# Patient Record
Sex: Male | Born: 1950 | Race: White | Hispanic: No | Marital: Married | Smoking: Former smoker
Health system: Southern US, Community
[De-identification: ages and names within clinical notes are randomized; demographics above are authoritative.]

## PROBLEM LIST (undated history)

## (undated) DIAGNOSIS — M1A9XX1 Chronic gout, unspecified, with tophus (tophi): Secondary | ICD-10-CM

## (undated) DIAGNOSIS — Z972 Presence of dental prosthetic device (complete) (partial): Secondary | ICD-10-CM

## (undated) DIAGNOSIS — K219 Gastro-esophageal reflux disease without esophagitis: Secondary | ICD-10-CM

## (undated) DIAGNOSIS — D649 Anemia, unspecified: Secondary | ICD-10-CM

## (undated) DIAGNOSIS — M109 Gout, unspecified: Secondary | ICD-10-CM

## (undated) DIAGNOSIS — N189 Chronic kidney disease, unspecified: Secondary | ICD-10-CM

## (undated) DIAGNOSIS — G629 Polyneuropathy, unspecified: Secondary | ICD-10-CM

## (undated) DIAGNOSIS — Z87442 Personal history of urinary calculi: Secondary | ICD-10-CM

## (undated) HISTORY — PX: TONSILLECTOMY: SUR1361

## (undated) HISTORY — PX: VARICOSE VEIN SURGERY: SHX832

## (undated) HISTORY — PX: EYE SURGERY: SHX253

---

## 2011-07-27 ENCOUNTER — Ambulatory Visit: Payer: Self-pay

## 2012-05-24 DIAGNOSIS — L719 Rosacea, unspecified: Secondary | ICD-10-CM | POA: Insufficient documentation

## 2012-05-24 DIAGNOSIS — M1A00X1 Idiopathic chronic gout, unspecified site, with tophus (tophi): Secondary | ICD-10-CM | POA: Diagnosis present

## 2013-07-11 ENCOUNTER — Inpatient Hospital Stay: Payer: Self-pay | Admitting: Internal Medicine

## 2013-07-11 LAB — CBC
HGB: 14.9 g/dL (ref 13.0–18.0)
MCH: 34.3 pg — ABNORMAL HIGH (ref 26.0–34.0)
MCHC: 35 g/dL (ref 32.0–36.0)
MCV: 98 fL (ref 80–100)
RBC: 4.34 10*6/uL — ABNORMAL LOW (ref 4.40–5.90)
RDW: 15 % — ABNORMAL HIGH (ref 11.5–14.5)
WBC: 12.1 10*3/uL — ABNORMAL HIGH (ref 3.8–10.6)

## 2013-07-11 LAB — BASIC METABOLIC PANEL
Anion Gap: 8 (ref 7–16)
BUN: 9 mg/dL (ref 7–18)
Chloride: 100 mmol/L (ref 98–107)
Co2: 24 mmol/L (ref 21–32)
Creatinine: 0.73 mg/dL (ref 0.60–1.30)
EGFR (African American): >60
EGFR (Non-African Amer.): >60
Osmolality: 264 (ref 275–301)
Potassium: 3.4 mmol/L — ABNORMAL LOW (ref 3.5–5.1)
Sodium: 132 mmol/L — ABNORMAL LOW (ref 136–145)

## 2013-07-12 LAB — CBC WITH DIFFERENTIAL/PLATELET
Basophil #: 0 10*3/uL (ref 0.0–0.1)
Eosinophil #: 0 10*3/uL (ref 0.0–0.7)
Eosinophil %: 0 %
HCT: 39.2 % — ABNORMAL LOW (ref 40.0–52.0)
Lymphocyte #: 0.4 10*3/uL — ABNORMAL LOW (ref 1.0–3.6)
Lymphocyte %: 5 %
MCH: 34.6 pg — ABNORMAL HIGH (ref 26.0–34.0)
MCHC: 35.4 g/dL (ref 32.0–36.0)
MCV: 98 fL (ref 80–100)
Monocyte #: 0.2 x10 3/mm (ref 0.2–1.0)
Neutrophil %: 92.6 %
Platelet: 142 10*3/uL — ABNORMAL LOW (ref 150–440)
RBC: 4.01 10*6/uL — ABNORMAL LOW (ref 4.40–5.90)
RDW: 14.3 % (ref 11.5–14.5)

## 2013-07-12 LAB — SYNOVIAL CELL COUNT + DIFF, W/ CRYSTALS
Eosinophil: 0 %
Neutrophils: 94 %
Nucleated Cell Count: 6141 /mm3
Other Cells BF: 0 %
Other Mononuclear Cells: 4 %

## 2013-07-12 LAB — BASIC METABOLIC PANEL
Anion Gap: 8 (ref 7–16)
BUN: 10 mg/dL (ref 7–18)
Calcium, Total: 8.9 mg/dL (ref 8.5–10.1)
Chloride: 101 mmol/L (ref 98–107)
EGFR (African American): >60
EGFR (Non-African Amer.): >60
Glucose: 133 mg/dL — ABNORMAL HIGH (ref 65–99)
Osmolality: 269 (ref 275–301)
Potassium: 3.8 mmol/L (ref 3.5–5.1)

## 2013-07-13 LAB — URIC ACID: Uric Acid: 5.8 mg/dL (ref 3.5–7.2)

## 2015-02-22 NOTE — Consult Note (Signed)
Brief Consult Note: Diagnosis: severe gout.   Patient was seen by consultant.   Comments: will recheck in am.  Electronic Signatures: Laurene Footman (MD)  (Signed 10-Sep-14 17:01)  Authored: Brief Consult Note   Last Updated: 10-Sep-14 17:01 by Laurene Footman (MD)

## 2015-02-22 NOTE — H&P (Signed)
PATIENT NAME:  Mark Jimenez, Mark Jimenez MR#:  [id number] DATE OF BIRTH:  1951-07-28  DATE OF ADMISSION:  07/11/2013  PRIMARY CARE PHYSICIAN: Dr. Petra Kuba in [place].   PRIMARY RHEUMATOLOGIST: At [facility].   REFERRING EMERGENCY ROOM PHYSICIAN: Dr. Lenise Arena.     CHIEF COMPLAINT: Joint pain.   HISTORY OF PRESENTING ILLNESS: A 64 year old male with a past medical history of gout since many years and gastric esophageal reflux disease who has his usual aches and pains on and off, which are slightly worse because of his increased activity for the last 2 weeks, but today morning he started having excruciating pain, especially in both knees and he was not able even \ stand up or to try to walk, so he decided to come to the Emergency Room. On arrival to ER, he was noted having acute gout flare and was given IV Dilaudid, and he felt a little better with that, but still not able to walk properly because of severe pain, and he is requiring IV pain medications, so hospitalist service is contacted to admit him for acute gouty attack. On further questioning, the patient denies having any fever or any injuries to his knees, and he says that all his joints are hurting, mainly knees, which is preventing him from walking.    REVIEW OF SYSTEMS:   CONSTITUTIONAL: Negative for fever, fatigue, weakness, but severe pain in the joints, mainly in the knees.  EYES: No blurring, double vision, pain or redness.  EARS, NOSE, THROAT: No tinnitus, ear pain or hearing loss.  RESPIRATORY: No cough, wheezing, hemoptysis, dyspnea or shortness of breath.  CARDIOVASCULAR: No chest pain, orthopnea, edema or palpitations.  GASTROINTESTINAL: No nausea, vomiting, diarrhea or abdominal pain.  GENITOURINARY: No dysuria, hematuria or increased frequency of urination.  ENDOCRINE: No nocturia, increased sweating or heat or cold intolerance.  SKIN: No acne or rashes.  MUSCULOSKELETAL: He has gout since many years, and he has enlarged nodes  in joints, swollen joints since many years, and due to that his mobility is restricted.  NEUROLOGICAL: Denies any numbness, weakness, tremors or vertigo.   PSYCHIATRIC: No anxiety, insomnia or bipolar disorder.   PAST MEDICAL HISTORY:  Chronic fibromyalgia, neuropathy, anxiety.   PAST SURGICAL HISTORY: He had varicose vein surgery on the right lower extremity 14 years ago, and since then his right lower extremity is remaining swollen.   SOCIAL HISTORY:  He chews cigars once a day. He drinks alcohol socially. He is a retired Clinical biochemist.   FAMILY HISTORY: Brother and mother both had diabetes.   HOME MEDICATIONS: Omeprazole 40 mg 2 times a day and indomethacin 50 mg capsule 3 times a day. He takes colchicine when he needs it for his pain.   PHYSICAL EXAMINATION: VITAL SIGNS: In the ER, temperature 98.3, pulse 96, respirations 18, blood pressure 164/79 and pulse oximetry 99 on room air.  GENERAL: The patient is fully alert and oriented to time, place and person and does not appear in any acute distress.  HEENT: Head and neck atraumatic. Conjunctivae pink. Oral mucosa moist.  NECK: Supple. No JVD.  RESPIRATORY: Bilateral clear and equal air entry.  CARDIOVASCULAR: S1, S2 present, regular. No murmur.  ABDOMEN: Soft, nontender. Bowel sounds present. No organomegaly.  SKIN: No rashes.  EXTREMITIES: Legs:  Bilateral chronic edema present. Joints:  All of his fingers and his elbows, as well as his toes and knees are swollen, which are chronically and painful. His knee on the right side is extremely tender on touch  and is warm also, and it appears red. There is chronic deformity and deviation of all his fingers and toes.  NEUROLOGICAL: Power 4/5, not able to move properly due to his severe swelling on the joints. No tremors or rigidity.  PSYCHIATRIC:  Not any acute distress.   LABORATORY RESULTS:  Glucose 112, BUN 9, creatinine 0.73, sodium 132, potassium 3.4, chloride 100, CO2 of 24, magnesium  1.7. White cell count 12.1, hemoglobin 14.9, platelet 155. Ultrasound of the lower extremity Doppler study, no evidence of thrombus. Popliteal cysts bilaterally and enlarging inguinal lymph nodes bilaterally.   ASSESSMENT AND PLAN: A 64 year old male with:  1. Past medical history of chronic gout, presented with acute gout flare. We will give him IV Solu-Medrol for his inflammatory response, and we will give IV Dilaudid for better pain and oral Percocet. We will also start him on oral colchicine and oral indomethacin oral long-term control of his gout. We will consult hematologist and orthopedics, as patient is not being followed by any hematologist and has an appointment with orthopedics, which is not done yet at [facility].  2.  Gastroesophageal reflux disease. We will continue Protonix.  3.  Hypokalemia and hypomagnesemia. We are replacing IV and orally, and we will check  tomorrow.   CODE STATUS: FULL CODE.   TOTAL TIME SPENT: 50 minutes in this admission.    ____________________________ Ceasar Lund Anselm Jungling, MD vgv:dmm D: 07/11/2013 20:01:21 ET T: 07/11/2013 20:16:06 ET JOB#: [id number]  cc: Ceasar Lund. Anselm Jungling, MD, <Dictator> Vaughan Basta MD ELECTRONICALLY SIGNED 07/18/2013 23:22

## 2015-02-22 NOTE — H&P (Signed)
PATIENT NAME:  Mark Jimenez, Mark Jimenez MR#:  [id number] DATE OF BIRTH:  March 01, 1951  ADDENDUM  ASSESSMENT AND PLAN: Tobacco abuse. The patient is chewing cigar every day and I counseled him for stopping the tobacco habit. He says he is not too much addicted to that and he will try to quit it, but he does not want a nicotine patch for that while here. Total time spent for counseling 4 minutes.    ____________________________ Ceasar Lund Anselm Jungling, MD vgv:np D: 07/11/2013 22:18:06 ET T: 07/11/2013 22:39:42 ET JOB#: [id number]  cc: Ceasar Lund. Anselm Jungling, MD, <Dictator> Vaughan Basta MD ELECTRONICALLY SIGNED 07/18/2013 23:22

## 2015-02-22 NOTE — Consult Note (Signed)
Brief Consult Note: Patient was seen by consultant.   Consult note dictated.   Orders entered.   Comments: 1 tophaceous gout , s/p intolerance of allopurinol, uloric ,  current flare in knees  2 suspect OA Knees 3 ETOH       left knee aspirated 60 cc cloudy fliud, sent for microscopy. both knees injected. cont colchine, pred taper. xray knees. probable candidate for peglitocase IV at med center(has apt at [facility]).  Electronic Signatures: Leeanne Mannan., Eugene Gavia (MD)  (Signed 10-Sep-14 17:44)  Authored: Brief Consult Note   Last Updated: 10-Sep-14 17:44 by Leeanne Mannan., Eugene Gavia (MD)

## 2015-02-22 NOTE — Discharge Summary (Signed)
PATIENT NAME:  Mark Jimenez, Mark Jimenez MR#:  [id number] DATE OF BIRTH:  08-13-1951  DATE OF ADMISSION:  07/11/2013 DATE OF DISCHARGE:  07/13/2013  DISCHARGE DIAGNOSES: 1.  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 with flare in the knee bilaterally status post left knee aspiration and bilateral knee injections, improving with Colcrys nonsteroidal anti-inflammatory medication and steroids.  2.  Hypokalemia and hypomagnesemia, repleted and resolved.   SECONDARY DIAGNOSES:  1.  Chronic fibromyalgia.  2.  Neuropathy.  3.  Anxiety.   CONSULTATIONS: 1.  Rheumatology, Dr. Jefm Bryant.  2.  Orthopedic, Dr. Hessie Knows.  3.  Physical therapy.   PROCEDURES AND RADIOLOGY:  1.  Left knee aspiration and bilateral knee injection of steroids.  2.  Left knee x-ray on 10th of September showed degenerative changes in the left knee.  No evidence of acute old fracture or dislocation.  Mild soft tissue swelling in the prepatellar region.   3.  Right knee x-ray on 10th of September showed diffuse osteopenia.  Moderate degenerative narrowing of the lateral joint compartment, mild narrowing medially.  Soft tissue calcification in the prepatellar region.  4.  Bilateral lower extremity Doppler on 9th of September showed no DVT.  Bilateral popliteal cyst.  Large inguinal lymph nodes bilaterally.   HISTORY AND SHORT HOSPITAL COURSE:  The patient is a 64 year old male with above-mentioned medical problem, was admitted for gout flare.  Please see Dr. Nichola Sizer dictated history and physical for further details.  Rheumatology consultation was obtained with Dr. Jefm Bryant who recommended continuing steroids and started him on colchicine along with nonsteroidals.  He also aspirated his left knee which showed monosodium urate crystals and injected bilateral knees which relieved the patient significantly.  The patient was also evaluated by orthopedic and did not have any further recommendation.  He was feeling much better  after injection, was able to walk with a walker and was discharged home on 11th of September in stable condition.   PHYSICAL EXAMINATION: VITAL SIGNS:  On the date of discharge temperature 97.6, heart rate 64 per minute, respirations 19 per minute, blood pressure 129/68 mmHg.  He was saturating 98% on room air.  Pertinent physical examination on the date of discharge:  CARDIOVASCULAR:  S1, S2 normal.  No murmurs, rubs or gallop.  LUNGS:  Clear to auscultation bilaterally.  No wheezing, rales, rhonchi or crepitation.  ABDOMEN:  Soft, benign.  EXTREMITIES:  He had large sebaceous changes throughout his fingers.  MUSCULOSKELETAL:  He had a large right elbow tophi.  Numerous hand tophaceous changes.  Left knee with contracture.  He had a large effusion on the right knee with warmth.  He also had a bilateral Baker's cyst and decreased range of motion of both ankles.  NEUROLOGIC:  Nonfocal examination.  All other physical examination remained at baseline.   DISCHARGE MEDICATIONS: 1.  Indomethacin 50 mg by mouth three times a day as needed.  2.  Omeprazole 40 mg by mouth twice daily.  3.  Prednisone 60 mg by mouth daily, taper 10 mg daily until finished.  4.  Colchicine 0.6 mg by mouth daily.   DISCHARGE DIET:  Regular.   DISCHARGE ACTIVITY:  As tolerated.   DISCHARGE INSTRUCTIONS AND FOLLOW-UP:  The patient was instructed to follow up with his primary care physician, Dr. Petra Kuba at [facility].  He was instructed to follow up with his primary rheumatology at [facility] in 2 to 4 weeks.  He was provided front-wheeled walker.   Total time discharging this  patient was 55 minutes.      ____________________________ Lucina Mellow. Manuella Ghazi, MD vss:ea D: 07/13/2013 23:05:13 ET T: 07/14/2013 00:04:24 ET JOB#: [id number]  cc: Ebrahim Deremer S. Manuella Ghazi, MD, <Dictator> Hall Busing. Petra Kuba, MD Meda Klinefelter., MD [facility] [place] MD ELECTRONICALLY SIGNED 07/14/2013 23:35

## 2015-02-22 NOTE — Consult Note (Signed)
PATIENT NAME:  Mark Jimenez, Mark Jimenez MR#:  [id number] DATE OF BIRTH:  12-29-1950  DATE OF CONSULTATION:  07/12/2013  REFERRING PHYSICIAN:  Dr. Max Sane. CONSULTING PHYSICIAN:  Meda Klinefelter., MD  REASON FOR CONSULTATION: This is a 64 year old white male who has a long history of alcohol, a long history of tophaceous gout. He has previously been seen by rheumatology at [facility]. He has been on disability. He has a family history of gout. He has previously been on allopurinol complicated by nausea and vomiting and hospitalization, and then Uloric complicated by nausea and vomiting. He has not been on probenecid. He does not have any kidney stone. He had a recent trip to the beach to watch a family member graduate, did a lot of walking. Knees had been bothering him. They have been bothering him off and on since, swollen in the back and the front of the knees. Can hardly ambulate.   He came to the Emergency Room where he was given IV steroids and admitted. He has been on colchicine and Indocin. Both knees still bother him. He cannot really ambulate. He has had tophaceous changes in his hands and has upcoming consultation at [facility] for possible excision. The left elbow has spontaneously drained. 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 He has not had any ear tophi. He continues to drink daily.   PAST MEDICAL HISTORY: Alcohol use, venous insufficiency, status post procedures, reflux, anxiety, tophaceous gout.   MEDICATIONS: Currently on Indocin, colchicine, IV steroids and oral prednisone and analgesics.   SOCIAL HISTORY: Alcohol use. No significant cigarettes.   FAMILY HISTORY: Positive for gout on his mom's side with uncles and cousins.   REVIEW OF SYSTEMS: No recent abdominal pain or chest pain or diarrhea.   PHYSICAL EXAMINATION:  GENERAL: Pleasant male complaining of knee pain, sitting on the side of the bed. Does not  really want to extend his left knee.  VITALS: Temperature 98, pulse 70,  respiratory rate 18, blood pressure 114/67.  CHEST: Clear.  HEART: No murmur.  ABDOMEN: Nontender.  HEENT: Bilateral helices of his ears are without any tophi.  OROPHARYNX: Clear.  SKIN: Brawny changes in the lower extremities with brawny edema, 1+ edema.  EXTREMITIES: Large sebaceous changes throughout his fingers.  MUSCULOSKELETAL: Good range of his neck and shoulders. Large right elbow tophi. Numerous hand tophaceous changes. Hips move well. Left knee with contracture. Large effusion, right knee, with warmth. Unclear if there is any definite effusion.  bilateral Bakers cysts. Decreased range of motion, both ankles.   PROCEDURE: Left knee was prepped in a sterile manner. I aspirated 60 mL of cloudy fluid. Injected with 2 mL of Xylocaine, 2 mL Marcaine and 1 mL of Kenalog.   Right knee was prepped in a sterile manner. Aspirate dry. Injected with 2 mL of Xylocaine, 2 mL Marcaine and 1 mL Kenalog.   IMPRESSION:  1.  Gouty flare of the knees. Cannot rule out underlying osteoarthritis of the knees.  2.  Tophaceous gout.  3.  Chronic alcohol use.  3.  FAILURE WITH INTOLERANCE TO ALLOPURINOL AND ULORIC.  PLAN:  1.  see how injection does. x-ray, both knees. Try to get out of bed with physical therapy.  2.  Taper prednisone. May keep on Indocin and colchicine.  3.  Probably this is a candidate for pegloticase IV treatment to lower uric acid which will need to be done at the medical center. He has an appointment at [facility].    ____________________________ Eugene Gavia  Leeanne Mannan., MD gwk:np D: 07/12/2013 17:51:00 ET T: 07/12/2013 19:25:34 ET JOB#: [id number]  cc: Meda Klinefelter., MD, <Dictator> Ovidio Hanger MD ELECTRONICALLY SIGNED 07/13/2013 13:22

## 2015-02-22 NOTE — Consult Note (Signed)
Brief Consult Note: Comments: knees much better.walking with walker. pos uric acids crystal in fliud. xrays show OA with calcifications... afebrile.less synovitis in knees.    rec. cont. colcrys daily. nsaids. taper steriod. uric acid. i gave him a pamphlet on peglitocase to discuss with his [facility] physicians.  Electronic Signatures: Leeanne Mannan., Eugene Gavia (MD)  (Signed 11-Sep-14 13:30)  Authored: Brief Consult Note   Last Updated: 11-Sep-14 13:30 by Leeanne Mannan., Eugene Gavia (MD)

## 2016-02-20 DIAGNOSIS — G609 Hereditary and idiopathic neuropathy, unspecified: Secondary | ICD-10-CM | POA: Diagnosis present

## 2016-02-20 DIAGNOSIS — E039 Hypothyroidism, unspecified: Secondary | ICD-10-CM | POA: Insufficient documentation

## 2016-02-20 DIAGNOSIS — F419 Anxiety disorder, unspecified: Secondary | ICD-10-CM | POA: Insufficient documentation

## 2020-08-30 ENCOUNTER — Emergency Department: Payer: Medicare Other

## 2020-08-30 ENCOUNTER — Other Ambulatory Visit: Payer: Self-pay

## 2020-08-30 ENCOUNTER — Inpatient Hospital Stay
Admission: EM | Admit: 2020-08-30 | Discharge: 2020-09-04 | DRG: 522 | Disposition: A | Discharge status: Skilled Nursing Facility | Payer: Medicare Other | Attending: Internal Medicine | Admitting: Internal Medicine

## 2020-08-30 DIAGNOSIS — M1A9XX1 Chronic gout, unspecified, with tophus (tophi): Secondary | ICD-10-CM | POA: Diagnosis present

## 2020-08-30 DIAGNOSIS — Z6823 Body mass index (BMI) 23.0-23.9, adult: Secondary | ICD-10-CM

## 2020-08-30 DIAGNOSIS — M4856XA Collapsed vertebra, not elsewhere classified, lumbar region, initial encounter for fracture: Secondary | ICD-10-CM | POA: Diagnosis present

## 2020-08-30 DIAGNOSIS — M1A00X1 Idiopathic chronic gout, unspecified site, with tophus (tophi): Secondary | ICD-10-CM | POA: Diagnosis present

## 2020-08-30 DIAGNOSIS — Z87891 Personal history of nicotine dependence: Secondary | ICD-10-CM

## 2020-08-30 DIAGNOSIS — R636 Underweight: Secondary | ICD-10-CM | POA: Diagnosis present

## 2020-08-30 DIAGNOSIS — N179 Acute kidney failure, unspecified: Secondary | ICD-10-CM | POA: Diagnosis present

## 2020-08-30 DIAGNOSIS — R296 Repeated falls: Secondary | ICD-10-CM | POA: Diagnosis present

## 2020-08-30 DIAGNOSIS — N1831 Chronic kidney disease, stage 3a: Secondary | ICD-10-CM | POA: Diagnosis present

## 2020-08-30 DIAGNOSIS — R030 Elevated blood-pressure reading, without diagnosis of hypertension: Secondary | ICD-10-CM

## 2020-08-30 DIAGNOSIS — I129 Hypertensive chronic kidney disease with stage 1 through stage 4 chronic kidney disease, or unspecified chronic kidney disease: Secondary | ICD-10-CM | POA: Diagnosis present

## 2020-08-30 DIAGNOSIS — N189 Chronic kidney disease, unspecified: Secondary | ICD-10-CM

## 2020-08-30 DIAGNOSIS — S72002A Fracture of unspecified part of neck of left femur, initial encounter for closed fracture: Secondary | ICD-10-CM | POA: Diagnosis present

## 2020-08-30 DIAGNOSIS — W19XXXA Unspecified fall, initial encounter: Secondary | ICD-10-CM

## 2020-08-30 DIAGNOSIS — G8918 Other acute postprocedural pain: Secondary | ICD-10-CM

## 2020-08-30 DIAGNOSIS — Z20822 Contact with and (suspected) exposure to covid-19: Secondary | ICD-10-CM | POA: Diagnosis present

## 2020-08-30 DIAGNOSIS — W010XXA Fall on same level from slipping, tripping and stumbling without subsequent striking against object, initial encounter: Secondary | ICD-10-CM | POA: Diagnosis present

## 2020-08-30 DIAGNOSIS — D62 Acute posthemorrhagic anemia: Secondary | ICD-10-CM | POA: Diagnosis not present

## 2020-08-30 DIAGNOSIS — G609 Hereditary and idiopathic neuropathy, unspecified: Secondary | ICD-10-CM | POA: Diagnosis present

## 2020-08-30 DIAGNOSIS — S72001A Fracture of unspecified part of neck of right femur, initial encounter for closed fracture: Secondary | ICD-10-CM

## 2020-08-30 DIAGNOSIS — M79605 Pain in left leg: Secondary | ICD-10-CM

## 2020-08-30 DIAGNOSIS — S72032A Displaced midcervical fracture of left femur, initial encounter for closed fracture: Principal | ICD-10-CM

## 2020-08-30 DIAGNOSIS — G629 Polyneuropathy, unspecified: Secondary | ICD-10-CM | POA: Diagnosis present

## 2020-08-30 DIAGNOSIS — F419 Anxiety disorder, unspecified: Secondary | ICD-10-CM | POA: Diagnosis present

## 2020-08-30 DIAGNOSIS — Z01811 Encounter for preprocedural respiratory examination: Secondary | ICD-10-CM

## 2020-08-30 DIAGNOSIS — M8580 Other specified disorders of bone density and structure, unspecified site: Secondary | ICD-10-CM

## 2020-08-30 DIAGNOSIS — E039 Hypothyroidism, unspecified: Secondary | ICD-10-CM | POA: Diagnosis present

## 2020-08-30 DIAGNOSIS — S32009A Unspecified fracture of unspecified lumbar vertebra, initial encounter for closed fracture: Secondary | ICD-10-CM

## 2020-08-30 DIAGNOSIS — Z01818 Encounter for other preprocedural examination: Secondary | ICD-10-CM

## 2020-08-30 HISTORY — DX: Polyneuropathy, unspecified: G62.9

## 2020-08-30 HISTORY — DX: Chronic gout, unspecified, with tophus (tophi): M1A.9XX1

## 2020-08-30 LAB — CBC WITH DIFFERENTIAL/PLATELET
Abs Immature Granulocytes: 0.05 10*3/uL (ref 0.00–0.07)
Basophils Absolute: 0 10*3/uL (ref 0.0–0.1)
Basophils Relative: 0 %
Eosinophils Absolute: 0 10*3/uL (ref 0.0–0.5)
Eosinophils Relative: 0 %
HCT: 29.9 % — ABNORMAL LOW (ref 39.0–52.0)
Hemoglobin: 10.6 g/dL — ABNORMAL LOW (ref 13.0–17.0)
Immature Granulocytes: 1 %
Lymphocytes Relative: 6 %
Lymphs Abs: 0.4 10*3/uL — ABNORMAL LOW (ref 0.7–4.0)
MCH: 35.6 pg — ABNORMAL HIGH (ref 26.0–34.0)
MCHC: 35.5 g/dL (ref 30.0–36.0)
MCV: 100.3 fL — ABNORMAL HIGH (ref 80.0–100.0)
Monocytes Absolute: 0.5 10*3/uL (ref 0.1–1.0)
Monocytes Relative: 7 %
Neutro Abs: 5.7 10*3/uL (ref 1.7–7.7)
Neutrophils Relative %: 86 %
Platelets: 165 10*3/uL (ref 150–400)
RBC: 2.98 MIL/uL — ABNORMAL LOW (ref 4.22–5.81)
RDW: 14.6 % (ref 11.5–15.5)
WBC: 6.7 10*3/uL (ref 4.0–10.5)
nRBC: 0 % (ref 0.0–0.2)

## 2020-08-30 LAB — COMPREHENSIVE METABOLIC PANEL
ALT: 38 U/L (ref 0–44)
AST: 46 U/L — ABNORMAL HIGH (ref 15–41)
Albumin: 3.6 g/dL (ref 3.5–5.0)
Alkaline Phosphatase: 101 U/L (ref 38–126)
Anion gap: 16 — ABNORMAL HIGH (ref 5–15)
BUN: 26 mg/dL — ABNORMAL HIGH (ref 8–23)
CO2: 18 mmol/L — ABNORMAL LOW (ref 22–32)
Calcium: 8.9 mg/dL (ref 8.9–10.3)
Chloride: 101 mmol/L (ref 98–111)
Creatinine, Ser: 1.96 mg/dL — ABNORMAL HIGH (ref 0.61–1.24)
GFR, Estimated: 36 mL/min — ABNORMAL LOW (ref >60)
Glucose, Bld: 115 mg/dL — ABNORMAL HIGH (ref 70–99)
Potassium: 3.7 mmol/L (ref 3.5–5.1)
Sodium: 135 mmol/L (ref 135–145)
Total Bilirubin: 3 mg/dL — ABNORMAL HIGH (ref 0.3–1.2)
Total Protein: 8.1 g/dL (ref 6.5–8.1)

## 2020-08-30 LAB — BRAIN NATRIURETIC PEPTIDE: B Natriuretic Peptide: 49.6 pg/mL (ref 0.0–100.0)

## 2020-08-30 LAB — RESPIRATORY PANEL BY RT PCR (FLU A&B, COVID)
Influenza A by PCR: NEGATIVE
Influenza B by PCR: NEGATIVE
SARS Coronavirus 2 by RT PCR: NEGATIVE

## 2020-08-30 LAB — PROTIME-INR
INR: 1 (ref 0.8–1.2)
Prothrombin Time: 12.7 seconds (ref 11.4–15.2)

## 2020-08-30 MED ORDER — SODIUM CHLORIDE 0.9 % IV SOLN: INTRAVENOUS | Status: DC

## 2020-08-30 MED ORDER — SENNOSIDES-DOCUSATE SODIUM 8.6-50 MG PO TABS: 1.0000 | ORAL_TABLET | Freq: Every evening | ORAL | Status: DC | PRN

## 2020-08-30 MED ORDER — CEFAZOLIN SODIUM-DEXTROSE 1-4 GM/50ML-% IV SOLN
1.0000 g | Freq: Once | INTRAVENOUS | Status: AC
  Administered 2020-08-31: 1 g via INTRAVENOUS
  Filled 2020-08-30: qty 50

## 2020-08-30 MED ORDER — LACTATED RINGERS IV BOLUS
1000.0000 mL | Freq: Once | INTRAVENOUS | Status: AC
  Administered 2020-08-30: 1000 mL via INTRAVENOUS

## 2020-08-30 MED ORDER — HYDROMORPHONE HCL 1 MG/ML IJ SOLN
1.0000 mg | Freq: Once | INTRAMUSCULAR | Status: AC
  Administered 2020-08-30: 1 mg via INTRAVENOUS
  Filled 2020-08-30: qty 1

## 2020-08-30 MED ORDER — LABETALOL HCL 5 MG/ML IV SOLN
10.0000 mg | Freq: Four times a day (QID) | INTRAVENOUS | Status: DC | PRN
  Filled 2020-08-30: qty 4

## 2020-08-30 MED ORDER — HYDROCODONE-ACETAMINOPHEN 5-325 MG PO TABS
1.0000 | ORAL_TABLET | Freq: Four times a day (QID) | ORAL | Status: DC | PRN
  Administered 2020-08-31 – 2020-09-01 (×3): 1 via ORAL
  Administered 2020-09-02 (×2): 2 via ORAL
  Administered 2020-09-02 – 2020-09-03 (×2): 1 via ORAL
  Administered 2020-09-03 – 2020-09-04 (×2): 2 via ORAL
  Administered 2020-09-04: 1 via ORAL
  Filled 2020-08-30 (×3): qty 1
  Filled 2020-08-30 (×4): qty 2
  Filled 2020-08-30 (×3): qty 1

## 2020-08-30 MED ORDER — MORPHINE SULFATE (PF) 2 MG/ML IV SOLN
0.5000 mg | INTRAVENOUS | Status: DC | PRN
  Administered 2020-08-31 (×3): 0.5 mg via INTRAVENOUS
  Filled 2020-08-30 (×3): qty 1

## 2020-08-30 MED ORDER — ZOLPIDEM TARTRATE 5 MG PO TABS: 5.0000 mg | ORAL_TABLET | Freq: Every evening | ORAL | Status: DC | PRN

## 2020-08-30 MED ORDER — ONDANSETRON HCL 4 MG/2ML IJ SOLN: 4.0000 mg | Freq: Three times a day (TID) | INTRAMUSCULAR | Status: DC | PRN

## 2020-08-30 NOTE — ED Notes (Signed)
This RN spoke to [place] PA about pt

## 2020-08-30 NOTE — Consult Note (Signed)
Reason for Consult: Left femoral neck fracture displaced Referring Physician: Dr. Chrissie Noa Asmar is an 68 y.o. male.  HPI: Patient is a 69 year old who has severe gout and has had frequent recent falls.  Been having hip pain and difficulty getting up as well as back pain and was found to have a displaced femoral neck fracture as well as an L1 compression fracture after coming to the emergency room today.  The fracture may be several days old in both cases.  He normally walks with a cane or stick outside the home walker inside the home.  His wife has MS and he cares for her although he is also very physically limited.  Past Medical History:  Diagnosis Date  . Chronic tophaceous gout   . Peripheral neuropathy     History reviewed. No pertinent surgical history.  History reviewed. No pertinent family history.  Social History:  reports that he has quit smoking. He has never used smokeless tobacco. He reports current alcohol use. He reports previous drug use.  Allergies: Not on File  Medications: I have reviewed the patient's current medications.  Results for orders placed or performed during the hospital encounter of 08/30/20 (from the past 48 hour(s))  Comprehensive metabolic panel     Status: Abnormal   Collection Time: 08/30/20  5:56 PM  Result Value Ref Range   Sodium 135 135 - 145 mmol/L   Potassium 3.7 3.5 - 5.1 mmol/L   Chloride 101 98 - 111 mmol/L   CO2 18 (L) 22 - 32 mmol/L   Glucose, Bld 115 (H) 70 - 99 mg/dL    Comment: Glucose reference range applies only to samples taken after fasting for at least 8 hours.   BUN 26 (H) 8 - 23 mg/dL   Creatinine, Ser 1.96 (H) 0.61 - 1.24 mg/dL   Calcium 8.9 8.9 - 10.3 mg/dL   Total Protein 8.1 6.5 - 8.1 g/dL   Albumin 3.6 3.5 - 5.0 g/dL   AST 46 (H) 15 - 41 U/L   ALT 38 0 - 44 U/L   Alkaline Phosphatase 101 38 - 126 U/L   Total Bilirubin 3.0 (H) 0.3 - 1.2 mg/dL   GFR, Estimated 36 (L) >60 mL/min    Comment:  (NOTE) Calculated using the CKD-EPI Creatinine Equation (2021)    Anion gap 16 (H) 5 - 15    Comment: Performed at [facility], [address] [place], [place] [address]  CBC with Differential     Status: Abnormal   Collection Time: 08/30/20  5:56 PM  Result Value Ref Range   WBC 6.7 4.0 - 10.5 K/uL   RBC 2.98 (L) 4.22 - 5.81 MIL/uL   Hemoglobin 10.6 (L) 13.0 - 17.0 g/dL   HCT 29.9 (L) 39 - 52 %   MCV 100.3 (H) 80.0 - 100.0 fL   MCH 35.6 (H) 26.0 - 34.0 pg   MCHC 35.5 30.0 - 36.0 g/dL   RDW 14.6 11.5 - 15.5 %   Platelets 165 150 - 400 K/uL   nRBC 0.0 0.0 - 0.2 %   Neutrophils Relative % 86 %   Neutro Abs 5.7 1.7 - 7.7 K/uL   Lymphocytes Relative 6 %   Lymphs Abs 0.4 (L) 0.7 - 4.0 K/uL   Monocytes Relative 7 %   Monocytes Absolute 0.5 0.1 - 1.0 K/uL   Eosinophils Relative 0 %   Eosinophils Absolute 0.0 0.0 - 0.5 K/uL   Basophils Relative 0 %  Basophils Absolute 0.0 0.0 - 0.1 K/uL   Immature Granulocytes 1 %   Abs Immature Granulocytes 0.05 0.00 - 0.07 K/uL    Comment: Performed at [facility], [address] [place], [place] [address]  Protime-INR     Status: None   Collection Time: 08/30/20  5:56 PM  Result Value Ref Range   Prothrombin Time 12.7 11.4 - 15.2 seconds   INR 1.0 0.8 - 1.2    Comment: (NOTE) INR goal varies based on device and disease states. Performed at [facility], [address] [place], [place] [address]   Brain natriuretic peptide     Status: None   Collection Time: 08/30/20  5:56 PM  Result Value Ref Range   B Natriuretic Peptide 49.6 0.0 - 100.0 pg/mL    Comment: Performed at [facility], [address] [place], [place] [address]    DG Chest 1 View  Result Date: 08/30/2020 CLINICAL DATA:  Pain status post fall EXAM: CHEST  1 VIEW COMPARISON:  None. FINDINGS: The heart size and mediastinal contours are within normal limits. Both lungs are clear. The visualized skeletal structures are unremarkable.  IMPRESSION: No active disease. Electronically Signed   By: Constance Holster M.D.   On: 08/30/2020 20:21   DG Lumbar Spine 2-3 Views  Result Date: 08/30/2020 CLINICAL DATA:  Fall with low back pain EXAM: LUMBAR SPINE - 2-3 VIEW COMPARISON:  None. FINDINGS: Mild diffuse gaseous prominence of the bowel. 5 non rib-bearing lumbar type vertebra. Mild dextroscoliosis. Sagittal alignment is within normal limits. Mild to moderate diffuse degenerative changes throughout the lumbar spine. Possible fracture through the anterior inferior L1 vertebral body. Age indeterminate but suspect mild chronic compression deformity at L2 and superior endplate of L3. IMPRESSION: 1. Possible acute fracture through the anterior inferior L1 vertebral body. Suggest CT for further evaluation. 2. Mild compression deformity at L2 and superior endplate L3, possibly chronic. Electronically Signed   By: Donavan Foil M.D.   On: 08/30/2020 20:16   CT Head Wo Contrast  Result Date: 08/30/2020 CLINICAL DATA:  Head trauma history of fall EXAM: CT HEAD WITHOUT CONTRAST TECHNIQUE: Contiguous axial images were obtained from the base of the skull through the vertex without intravenous contrast. COMPARISON:  None. FINDINGS: Brain: No acute territorial infarction, hemorrhage or intracranial mass. Moderate atrophy. Nondilated ventricles. Vascular: No hyperdense vessels.  Carotid vascular calcification Skull: Normal. Negative for fracture or focal lesion. Sinuses/Orbits: Mild mucosal thickening in the sinuses Other: None IMPRESSION: 1. No CT evidence for acute intracranial abnormality. 2. Atrophy. Electronically Signed   By: Donavan Foil M.D.   On: 08/30/2020 21:52   US Venous Img Lower Unilateral Left  Result Date: 08/30/2020 CLINICAL DATA:  Pain EXAM: RIGHT LOWER EXTREMITY VENOUS DOPPLER ULTRASOUND TECHNIQUE: Gray-scale sonography with compression, as well as color and duplex ultrasound, were performed to evaluate the deep venous system(s)  from the level of the common femoral vein through the popliteal and proximal calf veins. COMPARISON:  None. FINDINGS: VENOUS Normal compressibility of the common femoral, superficial femoral, and popliteal veins, as well as the visualized calf veins. Visualized portions of profunda femoral vein and great saphenous vein unremarkable. No filling defects to suggest DVT on grayscale or color Doppler imaging. Doppler waveforms show normal direction of venous flow, normal respiratory plasticity and response to augmentation. Limited views of the contralateral common femoral vein are unremarkable. OTHER There is a right-sided Baker cyst measuring 4.3 x 0.8 cm. Limitations: none IMPRESSION: 1. No  DVT. 2. Right-sided Baker's cyst. Electronically Signed   By: Constance Holster M.D.   On: 08/30/2020 19:42   DG Knee Complete 4 Views Left  Result Date: 08/30/2020 CLINICAL DATA:  Pain status post fall. EXAM: LEFT KNEE - COMPLETE 4+ VIEW COMPARISON:  None. FINDINGS: There are moderate tricompartmental degenerative changes. There is no large joint effusion. There is no acute displaced fracture or dislocation. Vascular calcifications are noted. There is prepatellar soft tissue swelling. De IMPRESSION: 1. No acute displaced fracture or dislocation. 2. Moderate tricompartmental degenerative changes. 3. Prepatellar soft tissue swelling. Electronically Signed   By: Constance Holster M.D.   On: 08/30/2020 20:17   DG Hip Unilat W or Wo Pelvis 2-3 Views Left  Result Date: 08/30/2020 CLINICAL DATA:  Pain status post fall EXAM: DG HIP (WITH OR WITHOUT PELVIS) 2-3V RIGHT; DG HIP (WITH OR WITHOUT PELVIS) 2-3V LEFT COMPARISON:  None. FINDINGS: There is no acute displaced fracture or dislocation involving the right hip. There is a probable cam type deformity. Mild degenerative changes are noted. There is an acute, displaced transcervical fracture of the proximal left femur. There is osteopenia. IMPRESSION: 1. Acute, displaced  transcervical fracture of the proximal left femur. 2. No acute displaced fracture or dislocation of the right hip. 3. Osteopenia. Electronically Signed   By: Constance Holster M.D.   On: 08/30/2020 20:20   DG Hip Unilat W or Wo Pelvis 2-3 Views Right  Result Date: 08/30/2020 CLINICAL DATA:  Pain status post fall EXAM: DG HIP (WITH OR WITHOUT PELVIS) 2-3V RIGHT; DG HIP (WITH OR WITHOUT PELVIS) 2-3V LEFT COMPARISON:  None. FINDINGS: There is no acute displaced fracture or dislocation involving the right hip. There is a probable cam type deformity. Mild degenerative changes are noted. There is an acute, displaced transcervical fracture of the proximal left femur. There is osteopenia. IMPRESSION: 1. Acute, displaced transcervical fracture of the proximal left femur. 2. No acute displaced fracture or dislocation of the right hip. 3. Osteopenia. Electronically Signed   By: Constance Holster M.D.   On: 08/30/2020 20:20    Review of Systems Blood pressure (!) 156/58, pulse 98, temperature 98.5 F (36.9 C), temperature source Oral, resp. rate 20, height 6' (1.829 m), weight 77.1 kg, SpO2 100 %. Physical Exam Left leg is shortened and externally rotated with trace dorsalis pedis pulse significant edema shortening of the leg and deformity to the foot that appears chronic.  Skin is intact around the hip with no ecchymosis.  Spine was not examined to prevent pain to the hip with motion. Radiographs reveal displaced femoral neck fracture with underlying osteoarthritis and L1 compression fracture that appears acute. Assessment/Plan: Recommendation is for left hip replacement.  Risks and benefits possible complications discussed and brochure given.  With his history of recurrent falls I will plan on cemented stem as there is less likely problems of periprosthetic fracture.  With regard to his lumbar fracture if that limits his ability to ambulate consider kyphoplasty.  Hessie Knows 08/30/2020, 11:02 PM

## 2020-08-30 NOTE — H&P (Addendum)
History and Physical    Mark Jimenez EYC:[id number] DOB: 21-Feb-1951 DOA: 08/30/2020  [facility]   Patient coming from: Home  I have personally briefly reviewed patient's old medical records in [facility]  Chief Complaint: Fall onto left leg  HPI: Mark Jimenez is a 69 y.o. male with medical history significant for tophaceous gout, peripheral neuropathy, hypothyroidism who presents to the emergency room following a fall in which his foot caught on something as he turned around while standing causing him to fall onto his left side.  Since the fall he has been having intractable pain in his left hip and low back.  He denied hitting his head and had no loss of consciousness and denies other injury.  He was previously in his usual state of health.  He denied preceding lightheadedness, chest pain or shortness of breath or palpitations, visual disturbance or one-sided weakness numbness or tingling. ED Course: On arrival in the emergency room his blood pressure was noted to be very elevated at 204/163 with otherwise normal vitals.  Blood work notable for creatinine of 1.96 with last creatinine on file 1.96 in 2017 and 0.69 in 2013.  Other lab abnormalities with mild anemia with hemoglobin 10.6 MCV of 100.3.  Patient had extensive imaging in the emergency room including x-rays of the left and right pelvis and left knee lumbar spine chest and venous Dopplers.  Abnormal findings included left hip x-ray that showed transcervical fracture of the left hip.  Venous Doppler was negative for DVT.  LS spine showed possible acute fracture through the anterior inferior L1 vertebral body with suggestion of CT for further evaluation.  CT LS spine pending. The emergency room provider spoke with Dr. Rudene Christians who per ER provider states will review chart and requested hospitalist admit.  Review of Systems: As per HPI otherwise all other systems on review of systems negative.    History reviewed. No  pertinent past medical history.  History reviewed. No pertinent surgical history.   reports that he has quit smoking. He has never used smokeless tobacco. He reports current alcohol use. He reports previous drug use.  Not on File  History reviewed. No pertinent family history.    Prior to Admission medications   Not on File    Physical Exam: Vitals:   08/30/20 1740 08/30/20 1741 08/30/20 1742  BP:   (!) 204/163  Pulse: 98    Resp: 20    Temp: 98.5 F (36.9 C)    TempSrc: Oral    SpO2: 100%    Weight:  77.1 kg   Height:  6' (1.829 m)      Vitals:   08/30/20 1740 08/30/20 1741 08/30/20 1742  BP:   (!) 204/163  Pulse: 98    Resp: 20    Temp: 98.5 F (36.9 C)    TempSrc: Oral    SpO2: 100%    Weight:  77.1 kg   Height:  6' (1.829 m)       Constitutional: Alert and oriented x 3 . Not in any apparent distress HEENT:      Head: Normocephalic and atraumatic.         Eyes: PERLA, EOMI, Conjunctivae are normal. Sclera is non-icteric.       Mouth/Throat: Mucous membranes are moist.       Neck: Supple with no signs of meningismus. Cardiovascular: Regular rate and rhythm. No murmurs, gallops, or rubs. 2+ symmetrical distal pulses are present . No JVD. No LE edema Respiratory:  Respiratory effort normal .Lungs sounds clear bilaterally. No wheezes, crackles, or rhonchi.  Gastrointestinal: Soft, non tender, and non distended with positive bowel sounds. No rebound or guarding. Genitourinary: No CVA tenderness. Musculoskeletal:  Shortening of left leg with external rotation .  Gouty arthropathy in hands wrists elbows. No cyanosis, or erythema of extremities.  Pain along lumbar spine Neurologic:  Face is symmetric. Moving all extremities. No gross focal neurologic deficits . Skin: Skin is warm, dry.  No rash or ulcers Psychiatric: Mood and affect are normal    Labs on Admission: I have personally reviewed following labs and imaging studies  CBC: Recent Labs  Lab  08/30/20 1756  WBC 6.7  NEUTROABS 5.7  HGB 10.6*  HCT 29.9*  MCV 100.3*  PLT 573   Basic Metabolic Panel: Recent Labs  Lab 08/30/20 1756  NA 135  K 3.7  CL 101  CO2 18*  GLUCOSE 115*  BUN 26*  CREATININE 1.96*  CALCIUM 8.9   GFR: Estimated Creatinine Clearance: 37.7 mL/min (A) (by C-G formula based on SCr of 1.96 mg/dL (H)). Liver Function Tests: Recent Labs  Lab 08/30/20 1756  AST 46*  ALT 38  ALKPHOS 101  BILITOT 3.0*  PROT 8.1  ALBUMIN 3.6   No results for input(s): LIPASE, AMYLASE in the last 168 hours. No results for input(s): AMMONIA in the last 168 hours. Coagulation Profile: Recent Labs  Lab 08/30/20 1756  INR 1.0   Cardiac Enzymes: No results for input(s): CKTOTAL, CKMB, CKMBINDEX, TROPONINI in the last 168 hours. BNP (last 3 results) No results for input(s): PROBNP in the last 8760 hours. HbA1C: No results for input(s): HGBA1C in the last 72 hours. CBG: No results for input(s): GLUCAP in the last 168 hours. Lipid Profile: No results for input(s): CHOL, HDL, LDLCALC, TRIG, CHOLHDL, LDLDIRECT in the last 72 hours. Thyroid Function Tests: No results for input(s): TSH, T4TOTAL, FREET4, T3FREE, THYROIDAB in the last 72 hours. Anemia Panel: No results for input(s): VITAMINB12, FOLATE, FERRITIN, TIBC, IRON, RETICCTPCT in the last 72 hours. Urine analysis: No results found for: COLORURINE, APPEARANCEUR, LABSPEC, PHURINE, GLUCOSEU, HGBUR, BILIRUBINUR, KETONESUR, PROTEINUR, UROBILINOGEN, NITRITE, LEUKOCYTESUR  Radiological Exams on Admission: DG Chest 1 View  Result Date: 08/30/2020 CLINICAL DATA:  Pain status post fall EXAM: CHEST  1 VIEW COMPARISON:  None. FINDINGS: The heart size and mediastinal contours are within normal limits. Both lungs are clear. The visualized skeletal structures are unremarkable. IMPRESSION: No active disease. Electronically Signed   By: Constance Holster M.D.   On: 08/30/2020 20:21   DG Lumbar Spine 2-3 Views  Result  Date: 08/30/2020 CLINICAL DATA:  Fall with low back pain EXAM: LUMBAR SPINE - 2-3 VIEW COMPARISON:  None. FINDINGS: Mild diffuse gaseous prominence of the bowel. 5 non rib-bearing lumbar type vertebra. Mild dextroscoliosis. Sagittal alignment is within normal limits. Mild to moderate diffuse degenerative changes throughout the lumbar spine. Possible fracture through the anterior inferior L1 vertebral body. Age indeterminate but suspect mild chronic compression deformity at L2 and superior endplate of L3. IMPRESSION: 1. Possible acute fracture through the anterior inferior L1 vertebral body. Suggest CT for further evaluation. 2. Mild compression deformity at L2 and superior endplate L3, possibly chronic. Electronically Signed   By: Donavan Foil M.D.   On: 08/30/2020 20:16   CT Head Wo Contrast  Result Date: 08/30/2020 CLINICAL DATA:  Head trauma history of fall EXAM: CT HEAD WITHOUT CONTRAST TECHNIQUE: Contiguous axial images were obtained from the base of the skull through  the vertex without intravenous contrast. COMPARISON:  None. FINDINGS: Brain: No acute territorial infarction, hemorrhage or intracranial mass. Moderate atrophy. Nondilated ventricles. Vascular: No hyperdense vessels.  Carotid vascular calcification Skull: Normal. Negative for fracture or focal lesion. Sinuses/Orbits: Mild mucosal thickening in the sinuses Other: None IMPRESSION: 1. No CT evidence for acute intracranial abnormality. 2. Atrophy. Electronically Signed   By: Donavan Foil M.D.   On: 08/30/2020 21:52   CT Lumbar Spine Wo Contrast  Result Date: 08/30/2020 CLINICAL DATA:  Low back pain, fall EXAM: CT LUMBAR SPINE WITHOUT CONTRAST TECHNIQUE: Multidetector CT imaging of the lumbar spine was performed without intravenous contrast administration. Multiplanar CT image reconstructions were also generated. COMPARISON:  Radiograph 08/30/2020 FINDINGS: Segmentation: For the purposes of reporting, first non rib-bearing lumbar vertebra  will be designated L1. Small rudimentary disc at S1-S2. Alignment: Mild dextroscoliosis.  Sagittal alignment is normal. Vertebrae: Suspected acute inferior endplate fracture at L1 that extends to the anterior inferior vertebral body. Mild superior endplate deformities at L2, L3, and T12. Endplate deformity at L2 is of uncertain age. Less than 20% loss of height of anterior vertebral body. Schmorl's node associated with L3 and T12 superior endplate deformities. Paraspinal and other soft tissues: No suspicious paravertebral or paraspinal soft tissue abnormality. Disc levels: At T12-L1, maintained disc space.  Foramen are patent bilaterally At L1-L2, maintained disc space. No high-grade canal stenosis. Mild facet degenerative changes. No high-grade foraminal narrowing. At L2-L3 broad-based disc bulge. Mild canal stenosis. Facet degenerative changes. No high-grade foraminal narrowing. At L3-L4, mild vacuum discs. Moderate broad-based disc bulge. Mild canal stenosis. Facet degenerative changes. No high-grade foraminal narrowing. At L4-L5, vacuum discs. Mild broad-based disc bulge. No high-grade canal stenosis. Ligamentum flavum thickening and facet degenerative change. Mild left foraminal narrowing. At L5-S1, mild disc space narrowing. Mild broad-based disc bulge. No significant canal stenosis. Facet degenerative changes. Mild bilateral foraminal narrowing. IMPRESSION: 1. Multiple compression deformities of the thoracolumbar spine. Suspect acute inferior endplate fracture at L1 that involves the anterior vertebral body. Age indeterminate mild superior endplate deformity at L2. Possible chronic superior endplate deformities at T12 and L3. MRI could be obtained to better evaluate for fracture acuity. 2. Multiple level degenerative changes throughout the lumbar spine. Electronically Signed   By: Donavan Foil M.D.   On: 08/30/2020 22:16   US Venous Img Lower Unilateral Left  Result Date: 08/30/2020 CLINICAL DATA:  Pain  EXAM: RIGHT LOWER EXTREMITY VENOUS DOPPLER ULTRASOUND TECHNIQUE: Gray-scale sonography with compression, as well as color and duplex ultrasound, were performed to evaluate the deep venous system(s) from the level of the common femoral vein through the popliteal and proximal calf veins. COMPARISON:  None. FINDINGS: VENOUS Normal compressibility of the common femoral, superficial femoral, and popliteal veins, as well as the visualized calf veins. Visualized portions of profunda femoral vein and great saphenous vein unremarkable. No filling defects to suggest DVT on grayscale or color Doppler imaging. Doppler waveforms show normal direction of venous flow, normal respiratory plasticity and response to augmentation. Limited views of the contralateral common femoral vein are unremarkable. OTHER There is a right-sided Baker cyst measuring 4.3 x 0.8 cm. Limitations: none IMPRESSION: 1. No DVT. 2. Right-sided Baker's cyst. Electronically Signed   By: Constance Holster M.D.   On: 08/30/2020 19:42   DG Knee Complete 4 Views Left  Result Date: 08/30/2020 CLINICAL DATA:  Pain status post fall. EXAM: LEFT KNEE - COMPLETE 4+ VIEW COMPARISON:  None. FINDINGS: There are moderate tricompartmental degenerative changes. There is  no large joint effusion. There is no acute displaced fracture or dislocation. Vascular calcifications are noted. There is prepatellar soft tissue swelling. De IMPRESSION: 1. No acute displaced fracture or dislocation. 2. Moderate tricompartmental degenerative changes. 3. Prepatellar soft tissue swelling. Electronically Signed   By: Constance Holster M.D.   On: 08/30/2020 20:17   DG Hip Unilat W or Wo Pelvis 2-3 Views Left  Result Date: 08/30/2020 CLINICAL DATA:  Pain status post fall EXAM: DG HIP (WITH OR WITHOUT PELVIS) 2-3V RIGHT; DG HIP (WITH OR WITHOUT PELVIS) 2-3V LEFT COMPARISON:  None. FINDINGS: There is no acute displaced fracture or dislocation involving the right hip. There is a probable  cam type deformity. Mild degenerative changes are noted. There is an acute, displaced transcervical fracture of the proximal left femur. There is osteopenia. IMPRESSION: 1. Acute, displaced transcervical fracture of the proximal left femur. 2. No acute displaced fracture or dislocation of the right hip. 3. Osteopenia. Electronically Signed   By: Constance Holster M.D.   On: 08/30/2020 20:20   DG Hip Unilat W or Wo Pelvis 2-3 Views Right  Result Date: 08/30/2020 CLINICAL DATA:  Pain status post fall EXAM: DG HIP (WITH OR WITHOUT PELVIS) 2-3V RIGHT; DG HIP (WITH OR WITHOUT PELVIS) 2-3V LEFT COMPARISON:  None. FINDINGS: There is no acute displaced fracture or dislocation involving the right hip. There is a probable cam type deformity. Mild degenerative changes are noted. There is an acute, displaced transcervical fracture of the proximal left femur. There is osteopenia. IMPRESSION: 1. Acute, displaced transcervical fracture of the proximal left femur. 2. No acute displaced fracture or dislocation of the right hip. 3. Osteopenia. Electronically Signed   By: Constance Holster M.D.   On: 08/30/2020 20:20     Assessment/Plan 69 year old male with history of tophaceous gout and peripheral neuropathy, presenting following an accidental fall sustaining left transcervical femur fracture and acute vertebral body fracture in which his foot caught on something as he turned around while standing causing him to fall onto his left side    Transcervical fracture of left femur, closed, initial encounter    Osteopenia determined by x-ray   Accidental fall   Preoperative clearance -Patient sustained accidental fall after he tripped while turning around in room.  Had no preceding symptoms -X-ray showing transcervical fracture of the left femur as well as osteopenia -Pain management -Dr. Rudene Christians consulted and plans to do surgery in the am of 08/31/20 -Patient at low risk for perioperative cardiopulmonary  events..  Suspect Acute closed fracture of L1 vertebral body (HCC) Multiple compression deformities of the thoracolumbar spine. - CT lumbar spine shows above -Pain management -Ortho consult, Dr. Rudene Christians  Suspect acute kidney injury superimposed on CKD ([place]) -Creatinine 1.96 but no recent creatinine on file -IV hydration and monitor renal function  Probable essential hypertension -Patient with no prior history of hypertension with blood pressure 200/103 in the ER -Labetalol as needed BP over 160/90 -Pain control and monitor blood pressure -We will likely need oral antihypertensives    Chronic gouty arthropathy with tophus (tophi) -Prior documentation of intolerance to allopurinol -Colchicine and prednisone as needed -Avoid NSAIDs if possible in view of creatinine    Idiopathic peripheral neuropathy -Appears stable     DVT prophylaxis: SCDs Code Status: full code  Family Communication:  none  Disposition Plan: Back to previous home environment Consults called: Orthopedics, Dr. Rudene Christians Status: Inpatient for inpatient only procedure    Athena Masse MD [facility]  08/30/2020, 9:46 PM

## 2020-08-30 NOTE — ED Triage Notes (Cosign Needed)
Emergency Medicine Provider Triage Evaluation Note  Mark Jimenez , a 69 y.o. male  was evaluated in triage.  Pt complains of fall resulting in lower back, bilateral leg pain.  Patient states that he has a history of peripheral neuropathy.  He was at home, had turned around, his foot caught on something in his house causing him to fall onto his left side.  Patient states that initially he was able to ambulate, however the pain in his lower back and specifically his left hip has been worsening.  He complains of pain to bilateral hips as well as the left knee.  He is also concerned as he has had increased edema to the left lower extremity as well as pain extending from the calf to the posterior knee and medial thigh.  Patient denies any history of CHF.  No history of DVT.  Patient states that this time he is unable to ambulate due to the pain..  Review of Systems  Positive: Positive for fall resulting in lower back, bilateral hip, left knee and left leg pain Negative: Patient did not hit his head, lose consciousness.  No chest pain, shortness of breath, abdominal pain.  No loss of bowel or bladder function or control.  Physical Exam  BP (!) 204/163 (BP Location: Left Arm)   Pulse 98   Temp 98.5 F (36.9 C) (Oral)   Resp 20   Ht 6' (1.829 m)   Wt 77.1 kg   SpO2 100%   BMI 23.06 kg/m  Gen:   Awake, no distress   HEENT:  Atraumatic  Resp:  Normal effort  Cardiac:  Normal rate  Abd:   Nondistended, nontender  MSK:   Visualization of the lumbar spine revealed no signs of trauma.  Patient is tender diffusely throughout the lumbar spine extending into the left SI joint.  No palpable abnormality or step-off.  Lamination of the left lower extremity reveals no shortening or rotation.  Patient has bruising and edema to the lateral hip.  Significant tenderness in this area.  Patient reports tenderness throughout the femur to the anterior knee.  Patient is also tender to palpation in the popliteal fossa.   No tenderness over the anterior shin.  Patient does have some calf tenderness along the left calf.  Patient has bilateral peripheral edema that is worse on the left than right.  Dorsalis pedis pulse intact to left lower extremity.  Sensation intact to the left lower extremity.  Examination of the right hip reveals no visible signs of trauma.  Greater trochanter with no other significant tenderness.  No shortening or rotation of the leg.  No tenderness over the right lower extremity.  Dorsalis pedis pulses sensation intact to the right lower extremity. Neuro:  Speech clear.  No gross neuro deficits  Medical Decision Making  Medically screening exam initiated at 6:02 PM.  Appropriate orders placed.  Mark Jimenez was informed that the remainder of the evaluation will be completed by another provider, this initial triage assessment does not replace that evaluation, and the importance of remaining in the ED until their evaluation is complete.  Clinical Impression  Follow, back, left lower extremity pain.  Patient presents emergency department complaining of worsening pain to his back, left hip, left knee and leg.  Patient fell several days ago, states that initially he was able to ambulate but the pain has been worsening.  No bowel or bladder dysfunction, saddle anesthesia.  He does have a history of peripheral neuropathy, fibromyalgia.  Patient states that at this time he is unable to ambulate due to the pain.  On exam, patient had significant tenderness throughout the lumbar spine, bilateral hips extending to the left knee.  Ecchymosis and edema around the left hip with no shortening or rotation of the leg.  Patient was also tender to left calf extending through the posterior knee to the medial thigh.  He does have peripheral edema bilaterally but it is worse on the left than right.  At this time imaging, labs is ordered.  Patient will have x-rays of the lumbar spine, bilateral hips, left knee.  Given the  worsening calf pain, edema of the left leg, I will order ultrasound as well.  Patient aware that this is a screening exam and that he will be further assessed when a room is available.   Darletta Moll, PA-C 08/30/20 1802

## 2020-08-30 NOTE — Progress Notes (Signed)
Patient arrived on unit.

## 2020-08-30 NOTE — ED Notes (Signed)
Informed Larita Fife of bed assignment

## 2020-08-30 NOTE — ED Provider Notes (Signed)
[facility] Emergency Department Provider Note ____________________________________________   First MD Initiated Contact with Patient 08/30/20 1934     (approximate)  I have reviewed the triage vital signs and the nursing notes.  HISTORY  Chief Complaint Leg Pain   HPI Mark Jimenez is a 69 y.o. malewho presents to the ED for evaluation of leg pain after a fall.   Chart review indicates no medical history in our system for 3 years. Previously documented history of anxiety, neuropathy and tophaceous gout.  Patient presents to the ED for evaluation of left hip pain after mechanical fall.  Patient reports a turning mechanism where he tripped over himself causing injury and falling onto his left side.  He is reporting left leg and knee pain primarily.  Currently reporting 10/10 intensity left leg pain that is aching in nature, radiating down his leg and constant.  Worsened with position changes.  Denies head trauma or syncope.  Past Medical History:  Diagnosis Date  . Chronic tophaceous gout   . Peripheral neuropathy     Patient Active Problem List   Diagnosis Date Noted  . Transcervical fracture of left femur, closed, initial encounter ([place]) 08/30/2020  . Accidental fall 08/30/2020  . Preoperative clearance 08/30/2020  . Osteopenia determined by x-ray 08/30/2020  . Closed left hip fracture, initial encounter ([place]) 08/30/2020  . Acute kidney injury superimposed on CKD ([place]) 08/30/2020  . Closed fracture of lumbar vertebral body ([place]) 08/30/2020  . Blood pressure elevated without history of HTN 08/30/2020  . Acquired hypothyroidism 02/20/2016  . Idiopathic peripheral neuropathy 02/20/2016  . Anxiety 02/20/2016  . 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 No pertinent surgical history.  Prior to Admission medications   Not on File    Allergies Patient has no allergy information on record.  History reviewed.  No pertinent family history.  Social History Social History   Tobacco Use  . Smoking status: Former Research scientist (life sciences)  . Smokeless tobacco: Never Used  Substance Use Topics  . Alcohol use: Yes  . Drug use: Not Currently    Review of Systems  Constitutional: No fever/chills Eyes: No visual changes. ENT: No sore throat. Cardiovascular: Denies chest pain. Respiratory: Denies shortness of breath. Gastrointestinal: No abdominal pain.  No nausea, no vomiting.  No diarrhea.  No constipation. Genitourinary: Negative for dysuria. Musculoskeletal: Positive for back pain, left hip pain and left leg pain. Skin: Negative for rash. Neurological: Negative for headaches, focal weakness or numbness.   ____________________________________________   PHYSICAL EXAM:  VITAL SIGNS: Vitals:   08/30/20 2231 08/30/20 2318  BP: (!) 156/58 (!) 141/64  Pulse:  98  Resp:    Temp:  98.3 F (36.8 C)  SpO2:  100%      Constitutional: Alert and oriented. Well appearing and in no acute distress.  Pleasant and conversational full sentences. Eyes: Conjunctivae are normal. PERRL. EOMI. Head: Atraumatic. Nose: No congestion/rhinnorhea. Mouth/Throat: Mucous membranes are moist.  Oropharynx non-erythematous. Neck: No stridor. No cervical spine tenderness to palpation. Cardiovascular: Normal rate, regular rhythm. Grossly normal heart sounds.  Good peripheral circulation. Respiratory: Normal respiratory effort.  No retractions. Lungs CTAB. Gastrointestinal: Soft , nondistended, nontender to palpation. No abdominal bruits. No CVA tenderness. Musculoskeletal: Scattered tophaceous gout nodules Left hip tenderness to palpation vaguely without overlying skin changes or signs of trauma.  Pain with left leg logrolling.  Left leg is distally neurovascularly intact. Vague tenderness to his lumbar back without bony step-offs or signs  of trauma to the back. Neurologic:  Normal speech and language. No gross focal neurologic  deficits are appreciated.  Skin:  Skin is warm, dry and intact.  Scattered and diffuse tophaceous gout deformities throughout his joints Psychiatric: Mood and affect are normal. Speech and behavior are normal.  ____________________________________________   LABS (all labs ordered are listed, but only abnormal results are displayed)  Labs Reviewed  COMPREHENSIVE METABOLIC PANEL - Abnormal; Notable for the following components:      Result Value   CO2 18 (*)    Glucose, Bld 115 (*)    BUN 26 (*)    Creatinine, Ser 1.96 (*)    AST 46 (*)    Total Bilirubin 3.0 (*)    GFR, Estimated 36 (*)    Anion gap 16 (*)    All other components within normal limits  CBC WITH DIFFERENTIAL/PLATELET - Abnormal; Notable for the following components:   RBC 2.98 (*)    Hemoglobin 10.6 (*)    HCT 29.9 (*)    MCV 100.3 (*)    MCH 35.6 (*)    Lymphs Abs 0.4 (*)    All other components within normal limits  RESPIRATORY PANEL BY RT PCR (FLU A&B, COVID)  PROTIME-INR  BRAIN NATRIURETIC PEPTIDE  CBC  BASIC METABOLIC PANEL   ____________________________________________  12 Lead EKG   ____________________________________________  RADIOLOGY  ED MD interpretation: Plain films of patient's pelvis and legs reviewed by me with evidence of a minimally displaced left femoral neck fracture.  Official radiology report(s): DG Chest 1 View  Result Date: 08/30/2020 CLINICAL DATA:  Pain status post fall EXAM: CHEST  1 VIEW COMPARISON:  None. FINDINGS: The heart size and mediastinal contours are within normal limits. Both lungs are clear. The visualized skeletal structures are unremarkable. IMPRESSION: No active disease. Electronically Signed   By: Constance Holster M.D.   On: 08/30/2020 20:21   DG Lumbar Spine 2-3 Views  Result Date: 08/30/2020 CLINICAL DATA:  Fall with low back pain EXAM: LUMBAR SPINE - 2-3 VIEW COMPARISON:  None. FINDINGS: Mild diffuse gaseous prominence of the bowel. 5 non rib-bearing  lumbar type vertebra. Mild dextroscoliosis. Sagittal alignment is within normal limits. Mild to moderate diffuse degenerative changes throughout the lumbar spine. Possible fracture through the anterior inferior L1 vertebral body. Age indeterminate but suspect mild chronic compression deformity at L2 and superior endplate of L3. IMPRESSION: 1. Possible acute fracture through the anterior inferior L1 vertebral body. Suggest CT for further evaluation. 2. Mild compression deformity at L2 and superior endplate L3, possibly chronic. Electronically Signed   By: Donavan Foil M.D.   On: 08/30/2020 20:16   CT Head Wo Contrast  Result Date: 08/30/2020 CLINICAL DATA:  Head trauma history of fall EXAM: CT HEAD WITHOUT CONTRAST TECHNIQUE: Contiguous axial images were obtained from the base of the skull through the vertex without intravenous contrast. COMPARISON:  None. FINDINGS: Brain: No acute territorial infarction, hemorrhage or intracranial mass. Moderate atrophy. Nondilated ventricles. Vascular: No hyperdense vessels.  Carotid vascular calcification Skull: Normal. Negative for fracture or focal lesion. Sinuses/Orbits: Mild mucosal thickening in the sinuses Other: None IMPRESSION: 1. No CT evidence for acute intracranial abnormality. 2. Atrophy. Electronically Signed   By: Donavan Foil M.D.   On: 08/30/2020 21:52   CT Lumbar Spine Wo Contrast  Result Date: 08/30/2020 CLINICAL DATA:  Low back pain, fall EXAM: CT LUMBAR SPINE WITHOUT CONTRAST TECHNIQUE: Multidetector CT imaging of the lumbar spine was performed without intravenous contrast administration.  Multiplanar CT image reconstructions were also generated. COMPARISON:  Radiograph 08/30/2020 FINDINGS: Segmentation: For the purposes of reporting, first non rib-bearing lumbar vertebra will be designated L1. Small rudimentary disc at S1-S2. Alignment: Mild dextroscoliosis.  Sagittal alignment is normal. Vertebrae: Suspected acute inferior endplate fracture at L1  that extends to the anterior inferior vertebral body. Mild superior endplate deformities at L2, L3, and T12. Endplate deformity at L2 is of uncertain age. Less than 20% loss of height of anterior vertebral body. Schmorl's node associated with L3 and T12 superior endplate deformities. Paraspinal and other soft tissues: No suspicious paravertebral or paraspinal soft tissue abnormality. Disc levels: At T12-L1, maintained disc space.  Foramen are patent bilaterally At L1-L2, maintained disc space. No high-grade canal stenosis. Mild facet degenerative changes. No high-grade foraminal narrowing. At L2-L3 broad-based disc bulge. Mild canal stenosis. Facet degenerative changes. No high-grade foraminal narrowing. At L3-L4, mild vacuum discs. Moderate broad-based disc bulge. Mild canal stenosis. Facet degenerative changes. No high-grade foraminal narrowing. At L4-L5, vacuum discs. Mild broad-based disc bulge. No high-grade canal stenosis. Ligamentum flavum thickening and facet degenerative change. Mild left foraminal narrowing. At L5-S1, mild disc space narrowing. Mild broad-based disc bulge. No significant canal stenosis. Facet degenerative changes. Mild bilateral foraminal narrowing. IMPRESSION: 1. Multiple compression deformities of the thoracolumbar spine. Suspect acute inferior endplate fracture at L1 that involves the anterior vertebral body. Age indeterminate mild superior endplate deformity at L2. Possible chronic superior endplate deformities at T12 and L3. MRI could be obtained to better evaluate for fracture acuity. 2. Multiple level degenerative changes throughout the lumbar spine. Electronically Signed   By: Donavan Foil M.D.   On: 08/30/2020 22:16   US Venous Img Lower Unilateral Left  Result Date: 08/30/2020 CLINICAL DATA:  Pain EXAM: RIGHT LOWER EXTREMITY VENOUS DOPPLER ULTRASOUND TECHNIQUE: Gray-scale sonography with compression, as well as color and duplex ultrasound, were performed to evaluate the  deep venous system(s) from the level of the common femoral vein through the popliteal and proximal calf veins. COMPARISON:  None. FINDINGS: VENOUS Normal compressibility of the common femoral, superficial femoral, and popliteal veins, as well as the visualized calf veins. Visualized portions of profunda femoral vein and great saphenous vein unremarkable. No filling defects to suggest DVT on grayscale or color Doppler imaging. Doppler waveforms show normal direction of venous flow, normal respiratory plasticity and response to augmentation. Limited views of the contralateral common femoral vein are unremarkable. OTHER There is a right-sided Baker cyst measuring 4.3 x 0.8 cm. Limitations: none IMPRESSION: 1. No DVT. 2. Right-sided Baker's cyst. Electronically Signed   By: Constance Holster M.D.   On: 08/30/2020 19:42   DG Knee Complete 4 Views Left  Result Date: 08/30/2020 CLINICAL DATA:  Pain status post fall. EXAM: LEFT KNEE - COMPLETE 4+ VIEW COMPARISON:  None. FINDINGS: There are moderate tricompartmental degenerative changes. There is no large joint effusion. There is no acute displaced fracture or dislocation. Vascular calcifications are noted. There is prepatellar soft tissue swelling. De IMPRESSION: 1. No acute displaced fracture or dislocation. 2. Moderate tricompartmental degenerative changes. 3. Prepatellar soft tissue swelling. Electronically Signed   By: Constance Holster M.D.   On: 08/30/2020 20:17   DG Hip Unilat W or Wo Pelvis 2-3 Views Left  Result Date: 08/30/2020 CLINICAL DATA:  Pain status post fall EXAM: DG HIP (WITH OR WITHOUT PELVIS) 2-3V RIGHT; DG HIP (WITH OR WITHOUT PELVIS) 2-3V LEFT COMPARISON:  None. FINDINGS: There is no acute displaced fracture or dislocation involving the right  hip. There is a probable cam type deformity. Mild degenerative changes are noted. There is an acute, displaced transcervical fracture of the proximal left femur. There is osteopenia. IMPRESSION: 1.  Acute, displaced transcervical fracture of the proximal left femur. 2. No acute displaced fracture or dislocation of the right hip. 3. Osteopenia. Electronically Signed   By: Constance Holster M.D.   On: 08/30/2020 20:20   DG Hip Unilat W or Wo Pelvis 2-3 Views Right  Result Date: 08/30/2020 CLINICAL DATA:  Pain status post fall EXAM: DG HIP (WITH OR WITHOUT PELVIS) 2-3V RIGHT; DG HIP (WITH OR WITHOUT PELVIS) 2-3V LEFT COMPARISON:  None. FINDINGS: There is no acute displaced fracture or dislocation involving the right hip. There is a probable cam type deformity. Mild degenerative changes are noted. There is an acute, displaced transcervical fracture of the proximal left femur. There is osteopenia. IMPRESSION: 1. Acute, displaced transcervical fracture of the proximal left femur. 2. No acute displaced fracture or dislocation of the right hip. 3. Osteopenia. Electronically Signed   By: Constance Holster M.D.   On: 08/30/2020 20:20    ____________________________________________   PROCEDURES and INTERVENTIONS  Procedure(s) performed (including Critical Care):  Procedures  Medications  HYDROcodone-acetaminophen (NORCO/VICODIN) 5-325 MG per tablet 1-2 tablet (has no administration in time range)  morphine 2 MG/ML injection 0.5 mg (0.5 mg Intravenous Given 08/31/20 0036)  0.9 %  sodium chloride infusion ( Intravenous New Bag/Given 08/31/20 0027)  zolpidem (AMBIEN) tablet 5 mg (has no administration in time range)  senna-docusate (Senokot-S) tablet 1 tablet (has no administration in time range)  ondansetron (ZOFRAN) injection 4 mg (has no administration in time range)  labetalol (NORMODYNE) injection 10 mg (has no administration in time range)  ceFAZolin (ANCEF) IVPB 1 g/50 mL premix (has no administration in time range)  lactated ringers bolus 1,000 mL (1,000 mLs Intravenous New Bag/Given 08/30/20 2229)  HYDROmorphone (DILAUDID) injection 1 mg (1 mg Intravenous Given 08/30/20 2225)     ____________________________________________   MDM / ED COURSE  69 year old male with known severe tophaceous gout presents to the ED after mechanical fall with left hip pain, found to have left hip fracture requiring medical admission and orthopedic repair.  Normal vital signs and room air.  Exam with tenderness and pain, suggestive of left hip fracture, otherwise no distress or neurovascular deficits.  Blood work demonstrates AKI that appears to be prerenal in etiology, for which he received 1 L of LR.  Imaging confirms fracture to his left hip.  He has age-indeterminate and possibly small acute compression fracture of his lumbar spine that does not require neurosurgical consultation or intervention at this time.  He has no neurovascular deficits.  Pain controlled with single dose of Dilaudid.  Will admit to hospitalist medicine with orthopedics consulting, plans for fixation in the morning.  Clinical Course as of Aug 31 113  Ludwig Clarks Aug 30, 2020  2101 Educated patient on diagnosis of hip fracture and need for admission.   [DS]  2129 Spoke with Dr. Damita Dunnings who agrees to admit the patient and requests that I speak with orthopedics to inform them.  Dr. Rudene Christians paged   [DS]  2134 Immediate callback from Dr. Rudene Christians, educating him of this patient.  He offers no immediate recommendations or scheduling indication, he will review the chart when able as he is currently occupied   [DS]    Clinical Course User Index [DS] Vladimir Crofts, MD     ____________________________________________   FINAL CLINICAL IMPRESSION(S) / ED  DIAGNOSES  Final diagnoses:  AKI (acute kidney injury) ([place])  Displaced fracture of left femoral neck ([facility])  Left leg pain  Fall, initial encounter  Tophaceous gout     ED Discharge Orders    None       Shahzaib Azevedo Tamala Julian   Note:  This document was prepared using Dragon voice recognition software and may include unintentional dictation errors.   Vladimir Crofts, MD 08/31/20  [phone]

## 2020-08-30 NOTE — ED Triage Notes (Addendum)
Pt to ED c/o bilateral leg and back pain after tripping last week and falling. Has neuropathy.  NAD noted, alert and oriented, clear speech  Has 20 G to R wrist from EMS Denies loss of bowel or bladder function Denies hitting head or LOC

## 2020-08-30 NOTE — ED Triage Notes (Signed)
Pt comes into the ED via [facility] from home c/o bilateral leg pain.  Pt had a fall 1 week ago and is now having constant shooting pains dow his legs.  H/o gout, fibromyalgia, neuropathy.  Pt states he isn't able to put weight on his legs.  71mcg of Fentanyl given. VSS with EMs.

## 2020-08-31 ENCOUNTER — Inpatient Hospital Stay: Payer: Medicare Other

## 2020-08-31 ENCOUNTER — Inpatient Hospital Stay: Payer: Medicare Other | Admitting: Certified Registered"

## 2020-08-31 ENCOUNTER — Encounter: Payer: Self-pay | Admitting: Internal Medicine

## 2020-08-31 ENCOUNTER — Encounter
Admission: EM | Disposition: A | Discharge status: Skilled Nursing Facility | Payer: Self-pay | Source: Home / Self Care | Attending: Hospitalist

## 2020-08-31 HISTORY — PX: TOTAL HIP ARTHROPLASTY: SHX124

## 2020-08-31 LAB — BASIC METABOLIC PANEL
Anion gap: 12 (ref 5–15)
BUN: 23 mg/dL (ref 8–23)
CO2: 20 mmol/L — ABNORMAL LOW (ref 22–32)
Calcium: 8.8 mg/dL — ABNORMAL LOW (ref 8.9–10.3)
Chloride: 104 mmol/L (ref 98–111)
Creatinine, Ser: 1.64 mg/dL — ABNORMAL HIGH (ref 0.61–1.24)
GFR, Estimated: 44 mL/min — ABNORMAL LOW (ref >60)
Glucose, Bld: 82 mg/dL (ref 70–99)
Potassium: 3.5 mmol/L (ref 3.5–5.1)
Sodium: 136 mmol/L (ref 135–145)

## 2020-08-31 LAB — CBC
HCT: 27.2 % — ABNORMAL LOW (ref 39.0–52.0)
Hemoglobin: 9.5 g/dL — ABNORMAL LOW (ref 13.0–17.0)
MCH: 35.4 pg — ABNORMAL HIGH (ref 26.0–34.0)
MCHC: 34.9 g/dL (ref 30.0–36.0)
MCV: 101.5 fL — ABNORMAL HIGH (ref 80.0–100.0)
Platelets: 142 10*3/uL — ABNORMAL LOW (ref 150–400)
RBC: 2.68 MIL/uL — ABNORMAL LOW (ref 4.22–5.81)
RDW: 14.5 % (ref 11.5–15.5)
WBC: 4.4 10*3/uL (ref 4.0–10.5)
nRBC: 0 % (ref 0.0–0.2)

## 2020-08-31 SURGERY — ARTHROPLASTY, HIP, TOTAL, ANTERIOR APPROACH
Anesthesia: Spinal | Site: Hip | Laterality: Left

## 2020-08-31 MED ORDER — METOCLOPRAMIDE HCL 5 MG/ML IJ SOLN: 5.0000 mg | Freq: Three times a day (TID) | INTRAMUSCULAR | Status: DC | PRN

## 2020-08-31 MED ORDER — DOCUSATE SODIUM 100 MG PO CAPS
100.0000 mg | ORAL_CAPSULE | Freq: Two times a day (BID) | ORAL | Status: DC
  Administered 2020-08-31 – 2020-09-03 (×7): 100 mg via ORAL
  Filled 2020-08-31 (×7): qty 1

## 2020-08-31 MED ORDER — ENSURE ENLIVE PO LIQD
237.0000 mL | Freq: Two times a day (BID) | ORAL | Status: DC
  Administered 2020-08-31 – 2020-09-04 (×6): 237 mL via ORAL
  Filled 2020-08-31 (×3): qty 237

## 2020-08-31 MED ORDER — FENTANYL CITRATE (PF) 100 MCG/2ML IJ SOLN
INTRAMUSCULAR | Status: AC
  Filled 2020-08-31: qty 2

## 2020-08-31 MED ORDER — MIDAZOLAM HCL 2 MG/2ML IJ SOLN
INTRAMUSCULAR | Status: AC
  Filled 2020-08-31: qty 2

## 2020-08-31 MED ORDER — PROPOFOL 500 MG/50ML IV EMUL
INTRAVENOUS | Status: AC
  Filled 2020-08-31: qty 50

## 2020-08-31 MED ORDER — BUPIVACAINE-EPINEPHRINE 0.25% -1:200000 IJ SOLN
INTRAMUSCULAR | Status: DC | PRN
  Administered 2020-08-31: 30 mL

## 2020-08-31 MED ORDER — BUPIVACAINE-EPINEPHRINE (PF) 0.25% -1:200000 IJ SOLN
INTRAMUSCULAR | Status: AC
  Filled 2020-08-31: qty 30

## 2020-08-31 MED ORDER — DIPHENHYDRAMINE HCL 12.5 MG/5ML PO ELIX: 12.5000 mg | ORAL_SOLUTION | ORAL | Status: DC | PRN

## 2020-08-31 MED ORDER — NEOMYCIN-POLYMYXIN B GU 40-200000 IR SOLN
Status: AC
  Filled 2020-08-31: qty 1

## 2020-08-31 MED ORDER — SODIUM CHLORIDE 0.9 % IV SOLN: INTRAVENOUS | Status: DC

## 2020-08-31 MED ORDER — KETAMINE HCL 50 MG/ML IJ SOLN
INTRAMUSCULAR | Status: AC
  Filled 2020-08-31: qty 10

## 2020-08-31 MED ORDER — MAGNESIUM HYDROXIDE 400 MG/5ML PO SUSP
30.0000 mL | Freq: Every day | ORAL | Status: DC | PRN
  Administered 2020-09-03: 30 mL via ORAL
  Filled 2020-08-31: qty 30

## 2020-08-31 MED ORDER — PHENOL 1.4 % MT LIQD
1.0000 | OROMUCOSAL | Status: DC | PRN
  Filled 2020-08-31: qty 177

## 2020-08-31 MED ORDER — TRANEXAMIC ACID-NACL 1000-0.7 MG/100ML-% IV SOLN
1000.0000 mg | Freq: Once | INTRAVENOUS | Status: DC
  Filled 2020-08-31: qty 100

## 2020-08-31 MED ORDER — PHENYLEPHRINE HCL (PRESSORS) 10 MG/ML IV SOLN
INTRAVENOUS | Status: AC
  Filled 2020-08-31: qty 1

## 2020-08-31 MED ORDER — ROCURONIUM BROMIDE 10 MG/ML (PF) SYRINGE
PREFILLED_SYRINGE | INTRAVENOUS | Status: AC
  Filled 2020-08-31: qty 10

## 2020-08-31 MED ORDER — ACETAMINOPHEN 10 MG/ML IV SOLN
INTRAVENOUS | Status: AC
  Filled 2020-08-31: qty 100

## 2020-08-31 MED ORDER — BUPIVACAINE HCL (PF) 0.5 % IJ SOLN
INTRAMUSCULAR | Status: AC
  Filled 2020-08-31: qty 10

## 2020-08-31 MED ORDER — ALUM & MAG HYDROXIDE-SIMETH 200-200-20 MG/5ML PO SUSP
30.0000 mL | ORAL | Status: DC | PRN
  Administered 2020-08-31: 30 mL via ORAL
  Filled 2020-08-31: qty 30

## 2020-08-31 MED ORDER — METOCLOPRAMIDE HCL 10 MG PO TABS
5.0000 mg | ORAL_TABLET | Freq: Three times a day (TID) | ORAL | Status: DC | PRN
  Administered 2020-09-02: 10 mg via ORAL

## 2020-08-31 MED ORDER — ENOXAPARIN SODIUM 40 MG/0.4ML ~~LOC~~ SOLN
40.0000 mg | SUBCUTANEOUS | Status: DC
  Administered 2020-09-01 – 2020-09-04 (×4): 40 mg via SUBCUTANEOUS
  Filled 2020-08-31 (×4): qty 0.4

## 2020-08-31 MED ORDER — FENTANYL CITRATE (PF) 100 MCG/2ML IJ SOLN: 25.0000 ug | INTRAMUSCULAR | Status: DC | PRN

## 2020-08-31 MED ORDER — BUPIVACAINE LIPOSOME 1.3 % IJ SUSP
INTRAMUSCULAR | Status: AC
  Filled 2020-08-31: qty 20

## 2020-08-31 MED ORDER — PROPOFOL 10 MG/ML IV BOLUS
INTRAVENOUS | Status: AC
  Filled 2020-08-31: qty 20

## 2020-08-31 MED ORDER — CEFAZOLIN SODIUM-DEXTROSE 2-4 GM/100ML-% IV SOLN
2.0000 g | Freq: Four times a day (QID) | INTRAVENOUS | Status: AC
  Administered 2020-08-31 (×2): 2 g via INTRAVENOUS
  Filled 2020-08-31 (×2): qty 100

## 2020-08-31 MED ORDER — SODIUM CHLORIDE FLUSH 0.9 % IV SOLN
INTRAVENOUS | Status: AC
  Filled 2020-08-31: qty 40

## 2020-08-31 MED ORDER — ONDANSETRON HCL 4 MG PO TABS: 4.0000 mg | ORAL_TABLET | Freq: Four times a day (QID) | ORAL | Status: DC | PRN

## 2020-08-31 MED ORDER — METHOCARBAMOL 1000 MG/10ML IJ SOLN
500.0000 mg | Freq: Four times a day (QID) | INTRAVENOUS | Status: DC | PRN
  Filled 2020-08-31: qty 5

## 2020-08-31 MED ORDER — MAGNESIUM CITRATE PO SOLN
1.0000 | Freq: Once | ORAL | Status: DC | PRN
  Filled 2020-08-31: qty 296

## 2020-08-31 MED ORDER — MENTHOL 3 MG MT LOZG
1.0000 | LOZENGE | OROMUCOSAL | Status: DC | PRN
  Filled 2020-08-31: qty 9

## 2020-08-31 MED ORDER — METHOCARBAMOL 500 MG PO TABS
500.0000 mg | ORAL_TABLET | Freq: Four times a day (QID) | ORAL | Status: DC | PRN
  Administered 2020-08-31 – 2020-09-02 (×3): 500 mg via ORAL
  Filled 2020-08-31 (×4): qty 1

## 2020-08-31 MED ORDER — PROPOFOL 500 MG/50ML IV EMUL
INTRAVENOUS | Status: DC | PRN
  Administered 2020-08-31: 75 ug/kg/min via INTRAVENOUS

## 2020-08-31 MED ORDER — ACETAMINOPHEN 10 MG/ML IV SOLN
INTRAVENOUS | Status: DC | PRN
  Administered 2020-08-31: 1000 mg via INTRAVENOUS

## 2020-08-31 MED ORDER — MIDAZOLAM HCL 2 MG/2ML IJ SOLN
INTRAMUSCULAR | Status: DC | PRN
  Administered 2020-08-31: 1 mg via INTRAVENOUS

## 2020-08-31 MED ORDER — BISACODYL 10 MG RE SUPP: 10.0000 mg | Freq: Every day | RECTAL | Status: DC | PRN

## 2020-08-31 MED ORDER — PANTOPRAZOLE SODIUM 40 MG PO TBEC
40.0000 mg | DELAYED_RELEASE_TABLET | Freq: Two times a day (BID) | ORAL | Status: DC
  Administered 2020-08-31 – 2020-09-04 (×8): 40 mg via ORAL
  Filled 2020-08-31 (×8): qty 1

## 2020-08-31 MED ORDER — FENTANYL CITRATE (PF) 100 MCG/2ML IJ SOLN
INTRAMUSCULAR | Status: DC | PRN
  Administered 2020-08-31: 15 ug via INTRAVENOUS

## 2020-08-31 MED ORDER — CHLORHEXIDINE GLUCONATE CLOTH 2 % EX PADS
6.0000 | MEDICATED_PAD | Freq: Every day | CUTANEOUS | Status: DC
  Administered 2020-08-31 – 2020-09-04 (×5): 6 via TOPICAL

## 2020-08-31 MED ORDER — LACTATED RINGERS IV SOLN: INTRAVENOUS | Status: DC | PRN

## 2020-08-31 MED ORDER — NEOMYCIN-POLYMYXIN B GU 40-200000 IR SOLN
Status: DC | PRN
  Administered 2020-08-31: 4 mL

## 2020-08-31 MED ORDER — ONDANSETRON HCL 4 MG/2ML IJ SOLN: 4.0000 mg | Freq: Four times a day (QID) | INTRAMUSCULAR | Status: DC | PRN

## 2020-08-31 MED ORDER — SODIUM CHLORIDE 0.9 % IV SOLN: INTRAVENOUS | Status: DC | PRN

## 2020-08-31 MED ORDER — KETAMINE HCL 10 MG/ML IJ SOLN
INTRAMUSCULAR | Status: DC | PRN
  Administered 2020-08-31: 10 mg via INTRAVENOUS
  Administered 2020-08-31: 25 mg via INTRAVENOUS

## 2020-08-31 MED ORDER — JUVEN PO PACK
1.0000 | PACK | Freq: Two times a day (BID) | ORAL | Status: DC
  Administered 2020-09-02 – 2020-09-04 (×5): 1 via ORAL
  Filled 2020-08-31 (×3): qty 1

## 2020-08-31 MED ORDER — LIDOCAINE HCL (PF) 2 % IJ SOLN
INTRAMUSCULAR | Status: AC
  Filled 2020-08-31: qty 5

## 2020-08-31 MED ORDER — ONDANSETRON HCL 4 MG/2ML IJ SOLN: 4.0000 mg | Freq: Once | INTRAMUSCULAR | Status: DC | PRN

## 2020-08-31 MED ORDER — SODIUM CHLORIDE 0.9% FLUSH
INTRAVENOUS | Status: DC | PRN
  Administered 2020-08-31: 40 mL

## 2020-08-31 MED ORDER — LIDOCAINE HCL (CARDIAC) PF 100 MG/5ML IV SOSY
PREFILLED_SYRINGE | INTRAVENOUS | Status: DC | PRN
  Administered 2020-08-31: 20 mg via INTRAVENOUS

## 2020-08-31 MED ORDER — PROPOFOL 10 MG/ML IV BOLUS
INTRAVENOUS | Status: DC | PRN
  Administered 2020-08-31: 20 mg via INTRAVENOUS
  Administered 2020-08-31: 30 mg via INTRAVENOUS
  Administered 2020-08-31: 20 mg via INTRAVENOUS
  Administered 2020-08-31: 10 mg via INTRAVENOUS

## 2020-08-31 SURGICAL SUPPLY — 71 items
BLADE SAGITTAL AGGR TOOTH XLG (BLADE) ×2 IMPLANT
BNDG COHESIVE 6X5 TAN STRL LF (GAUZE/BANDAGES/DRESSINGS) ×6 IMPLANT
BOWL CEMENT MIXING ADV NOZZLE (MISCELLANEOUS) ×2 IMPLANT
CANISTER SUCT 1200ML W/VALVE (MISCELLANEOUS) ×2 IMPLANT
CANISTER WOUND CARE 500ML ATS (WOUND CARE) ×2 IMPLANT
CEMENT BONE 40GM (Cement) ×4 IMPLANT
CHLORAPREP W/TINT 26 (MISCELLANEOUS) ×2 IMPLANT
COVER BACK TABLE REUSABLE LG (DRAPES) ×2 IMPLANT
COVER WAND RF STERILE (DRAPES) ×2 IMPLANT
DRAPE 3/4 80X56 (DRAPES) ×6 IMPLANT
DRAPE C-ARM XRAY 36X54 (DRAPES) ×2 IMPLANT
DRAPE INCISE IOBAN 66X60 STRL (DRAPES) IMPLANT
DRAPE POUCH INSTRU U-SHP 10X18 (DRAPES) ×2 IMPLANT
DRESSING PREVENA PLUS CUSTOM (GAUZE/BANDAGES/DRESSINGS) ×1 IMPLANT
DRESSING SURGICEL FIBRLLR 1X2 (HEMOSTASIS) ×2 IMPLANT
DRSG MEPILEX SACRM 8.7X9.8 (GAUZE/BANDAGES/DRESSINGS) ×2 IMPLANT
DRSG OPSITE POSTOP 4X8 (GAUZE/BANDAGES/DRESSINGS) ×4 IMPLANT
DRSG PREVENA PLUS CUSTOM (GAUZE/BANDAGES/DRESSINGS) ×2
DRSG SURGICEL FIBRILLAR 1X2 (HEMOSTASIS) ×4
ELECT BLADE 6.5 EXT (BLADE) ×2 IMPLANT
ELECT REM PT RETURN 9FT ADLT (ELECTROSURGICAL) ×2
ELECTRODE REM PT RTRN 9FT ADLT (ELECTROSURGICAL) ×1 IMPLANT
GLOVE BIOGEL PI IND STRL 9 (GLOVE) ×1 IMPLANT
GLOVE BIOGEL PI INDICATOR 9 (GLOVE) ×1
GLOVE SURG SYN 9.0  PF PI (GLOVE) ×2
GLOVE SURG SYN 9.0 PF PI (GLOVE) ×2 IMPLANT
GOWN SRG 2XL LVL 4 RGLN SLV (GOWNS) ×1 IMPLANT
GOWN STRL NON-REIN 2XL LVL4 (GOWNS) ×1
GOWN STRL REUS W/ TWL LRG LVL3 (GOWN DISPOSABLE) ×1 IMPLANT
GOWN STRL REUS W/TWL LRG LVL3 (GOWN DISPOSABLE) ×1
HEMOVAC 400CC 10FR (MISCELLANEOUS) IMPLANT
HIP FEM HD S 28 (Head) ×2 IMPLANT
HOLDER FOLEY CATH W/STRAP (MISCELLANEOUS) IMPLANT
HOOD PEEL AWAY FLYTE STAYCOOL (MISCELLANEOUS) ×2 IMPLANT
IRRIGATION SURGIPHOR STRL (IV SOLUTION) IMPLANT
KIT PREVENA INCISION MGT 13 (CANNISTER) ×4 IMPLANT
KNIFE SCULPS 14X20 (INSTRUMENTS) ×2 IMPLANT
LINER DM 28MM (Liner) ×2 IMPLANT
LINER DM SZH 28X56 (Liner) ×1 IMPLANT
MANIFOLD NEPTUNE II (INSTRUMENTS) ×2 IMPLANT
MAT ABSORB  FLUID 56X50 GRAY (MISCELLANEOUS) ×1
MAT ABSORB FLUID 56X50 GRAY (MISCELLANEOUS) ×1 IMPLANT
NDL SAFETY ECLIPSE 18X1.5 (NEEDLE) ×1 IMPLANT
NEEDLE HYPO 18GX1.5 SHARP (NEEDLE) ×1
NEEDLE SPNL 20GX3.5 QUINCKE YW (NEEDLE) ×4 IMPLANT
NOZZLE FLEX THIN (MISCELLANEOUS) ×2 IMPLANT
NS IRRIG 1000ML POUR BTL (IV SOLUTION) IMPLANT
PACK HIP COMPR (MISCELLANEOUS) ×2 IMPLANT
PRESSURIZER FEM CANAL M (MISCELLANEOUS) ×2 IMPLANT
RESTRICTOR CEMENT SZ 5 C-STEM (Cement) ×2 IMPLANT
SCALPEL PROTECTED #10 DISP (BLADE) ×4 IMPLANT
SHELL ACETABULAR DM  56MM (Shell) ×2 IMPLANT
SOL PREP PVP 2OZ (MISCELLANEOUS) ×2
SOLUTION PREP PVP 2OZ (MISCELLANEOUS) ×1 IMPLANT
SPONGE DRAIN TRACH 4X4 STRL 2S (GAUZE/BANDAGES/DRESSINGS) ×2 IMPLANT
STAPLER SKIN PROX 35W (STAPLE) ×2 IMPLANT
STEM FEM HIP SZ4 STD (Stem) ×2 IMPLANT
STRAP SAFETY 5IN WIDE (MISCELLANEOUS) ×2 IMPLANT
SUT DVC 2 QUILL PDO  T11 36X36 (SUTURE) ×1
SUT DVC 2 QUILL PDO T11 36X36 (SUTURE) ×1 IMPLANT
SUT SILK 0 (SUTURE) ×1
SUT SILK 0 30XBRD TIE 6 (SUTURE) ×1 IMPLANT
SUT V-LOC 90 ABS DVC 3-0 CL (SUTURE) ×2 IMPLANT
SUT VIC AB 1 CT1 36 (SUTURE) ×2 IMPLANT
SYR 20ML LL LF (SYRINGE) ×2 IMPLANT
SYR 30ML LL (SYRINGE) ×2 IMPLANT
SYR 50ML LL SCALE MARK (SYRINGE) ×4 IMPLANT
SYR BULB IRRIG 60ML STRL (SYRINGE) ×2 IMPLANT
TAPE MICROFOAM 4IN (TAPE) ×2 IMPLANT
TOWEL OR 17X26 4PK STRL BLUE (TOWEL DISPOSABLE) ×2 IMPLANT
TRAY FOLEY MTR SLVR 16FR STAT (SET/KITS/TRAYS/PACK) IMPLANT

## 2020-08-31 NOTE — Transfer of Care (Signed)
Immediate Anesthesia Transfer of Care Note  Patient: Mark Jimenez  Procedure(s) Performed: TOTAL HIP ARTHROPLASTY ANTERIOR APPROACH (Left Hip)  Patient Location: PACU  Anesthesia Type:MAC and Spinal  Level of Consciousness: awake, alert  and oriented  Airway & Oxygen Therapy: Patient Spontanous Breathing and Patient connected to nasal cannula oxygen  Post-op Assessment: Report given to RN and Post -op Vital signs reviewed and stable  Post vital signs: Reviewed and stable  Last Vitals:  Vitals Value Taken Time  BP 117/55   Temp 97.1   Pulse 85 08/31/20 1058  Resp 13 08/31/20 1058  SpO2 100 % 08/31/20 1058  Vitals shown include unvalidated device data.  Last Pain:  Vitals:   08/31/20 0700  TempSrc:   PainSc: 10-Worst pain ever         Complications: No complications documented.

## 2020-08-31 NOTE — Plan of Care (Signed)
  Problem: Education: Goal: Knowledge of General Education information will improve Description: Including pain rating scale, medication(s)/side effects and non-pharmacologic comfort measures Outcome: Progressing   Problem: Clinical Measurements: Goal: Ability to maintain clinical measurements within normal limits will improve Outcome: Progressing Goal: Will remain free from infection Outcome: Progressing Goal: Diagnostic test results will improve Outcome: Progressing Goal: Respiratory complications will improve Outcome: Progressing Goal: Cardiovascular complication will be avoided Outcome: Progressing   Problem: Clinical Measurements: Goal: Will remain free from infection Outcome: Progressing   Problem: Education: Goal: Knowledge of General Education information will improve Description: Including pain rating scale, medication(s)/side effects and non-pharmacologic comfort measures Outcome: Progressing

## 2020-08-31 NOTE — Op Note (Signed)
08/31/2020  10:58 AM  PATIENT:  Mark Jimenez  69 y.o. male  PRE-OPERATIVE DIAGNOSIS:  left femur fracture left femoral neck fracture with osteoarthritis  POST-OPERATIVE DIAGNOSIS: Same  PROCEDURE:  Procedure(s): TOTAL HIP ARTHROPLASTY ANTERIOR APPROACH (Left)  SURGEON: Laurene Footman, MD  ASSISTANTS: None  ANESTHESIA:   spinal  EBL:  Total I/O In: 1200 [I.V.:1100; IV Piggyback:100] Out: 750 [Urine:500; Blood:250]  BLOOD ADMINISTERED:none  DRAINS: Incisional wound VAC   LOCAL MEDICATIONS USED:  MARCAINE    and OTHER Exparel  SPECIMEN:  Source of Specimen:  Femoral head neck  DISPOSITION OF SPECIMEN:  PATHOLOGY  COUNTS:  YES  TOURNIQUET:  * No tourniquets in log *  IMPLANTS: Medacta cemented AMIS 4 standard stem with 5 cement plug from [employer], S metal 28 mm head and 56 mm Mpact TM cup and liner  DICTATION: .Dragon Dictation   The patient was brought to the operating room and after spinal anesthesia was obtained patient was placed on the operative table with the ipsilateral foot into the Medacta attachment, contralateral leg on a well-padded table. C-arm was brought in and preop template x-ray taken. After prepping and draping in usual sterile fashion appropriate patient identification and timeout procedures were completed. Anterior approach to the hip was obtained and centered over the greater trochanter and TFL muscle. The subcutaneous tissue was incised hemostasis being achieved by electrocautery. TFL fascia was incised and the muscle retracted laterally deep retractor placed. The lateral femoral circumflex vessels were identified and ligated. The anterior capsule was exposed and a capsulotomy performed. The neck was identified and a femoral neck cut carried out below the level of the fracture with a saw. The head was removed without difficulty and showed sclerotic femoral head and acetabulum. Reaming was carried out to 56 mm and a 56 mm cup trial gave appropriate tightness  to the acetabular component a 56 DM cup was impacted into position. The leg was then externally rotated and ischiofemoral and pubofemoral releases carried out. The femur was sequentially broached to a size 5, size 5 standard stem with S head trials were placed and the final components chosen. The canal was prepared with the cement restrictor placed on the cement was mixed the cement was inserted down the canal and pressurized. The 4 cemented stem was inserted and after the cement had set and excess cement had been removed repeat trials were made and a metal S 28 mm head and 56 mm liner were placed. The hip was reduced and was stable the wound was thoroughly irrigated with fibrillar placed along the posterior capsule and medial neck. The deep fascia ws closed using a heavy Quill after infiltration of 30 cc of quarter percent Sensorcaine with Exparel diluted injected throughout the case .3-0 V-loc to close the skin with skin staples. Incisional wound VAC applied and patient was sent to recovery in stable condition.   PLAN OF CARE: Admit to inpatient

## 2020-08-31 NOTE — Progress Notes (Signed)
Initial Nutrition Assessment  DOCUMENTATION CODES:   Not applicable  INTERVENTION:  Once diet has advanced, will provide  -Ensure Enlive po BID, each supplement provides 350 kcal and 20 grams of protein  - Juven BID, each packet provides 95 calories, 2.5 grams of protein (collagen), and 9.8 grams of carbohydrate (3 grams sugar); also contains 7 grams of L-arginine and L-glutamine, 300 mg vitamin C, 15 mg vitamin E, 1.2 mcg vitamin B-12, 9.5 mg zinc, 200 mg calcium, and 1.5 g  Calcium Beta-hydroxy-Beta-methylbutyrate to support wound healing    NUTRITION DIAGNOSIS:   Increased nutrient needs related to post-op healing, hip fracture as evidenced by estimated needs.    GOAL:   Patient will meet greater than or equal to 90% of their needs    MONITOR:   Diet advancement, Labs, PO intake, Weight trends, Skin, I & O's  REASON FOR ASSESSMENT:   Consult Assessment of nutrition requirement/status (hip fracture)  ASSESSMENT:  RD working remotely.  69 year old male with history of tophaceous gout, HTN, and peripheral neuropathy. Pt presented with left hip and low back pain after mechanical fall at home and found to have transcervical fracture of left femur as well as possible acute fracture through anterior L1 vertebral body.  Pt is currently off floor for total hip arthroplasty, unable to contact via phone to obtain history at this time. He is currently NPO, regular diet order is signed and held. With diet advancement, will order Ensure to aid with meeting needs as well as Juven to support post-op wound healing.   No recent past wt history for review. Suspect wt on admission was stated, will order daily weights and adjust estimated needs accordingly.  Medications reviewed and include:  IVF: NaCl @ 100 ml/hr Labs: Cr 1.64 (H), Hgb 9.5 (L), HCT 27.2 (L)   NUTRITION - FOCUSED PHYSICAL EXAM: Unable to complete at this time, RD working remotely.   Diet Order:   Diet Order             Diet NPO time specified  Diet effective midnight                 EDUCATION NEEDS:   No education needs have been identified at this time  Skin:  Skin Assessment: Skin Integrity Issues: Skin Integrity Issues:: Other (Comment) Other: ecchymosis; bilateral leg, arm  Last BM:  10/25  Height:   Ht Readings from Last 1 Encounters:  08/30/20 6' (1.829 m)    Weight:   Wt Readings from Last 1 Encounters:  08/30/20 77.1 kg   BMI:  Body mass index is 23.06 kg/m.  Estimated Nutritional Needs:   Kcal:  2100-2300  Protein:  100-115  Fluid:  > 1.9 L/day   Lajuan Lines, RD, LDN Clinical Nutrition After Hours/Weekend Pager # in [employer]

## 2020-08-31 NOTE — Anesthesia Preprocedure Evaluation (Addendum)
Anesthesia Evaluation  Patient identified by MRN, date of birth, ID band Patient awake  General Assessment Comment:Hip fracture after fall Possible L1 vertebral fracture as well  Reviewed: Allergy & Precautions, NPO status , Patient's Chart, lab work & pertinent test results  History of Anesthesia Complications Negative for: history of anesthetic complications  Airway Mallampati: II       Dental  (+) Missing, Loose, Chipped, Poor Dentition   Pulmonary neg pulmonary ROS, neg sleep apnea, neg COPD, Patient abstained from smoking.Not current smoker, former smoker,           Cardiovascular Exercise Tolerance: Good (-) hypertension(-) Past MI and (-) CHF negative cardio ROS  (-) dysrhythmias (-) Valvular Problems/Murmurs     Neuro/Psych PSYCHIATRIC DISORDERS Anxiety  Neuromuscular disease (idiopathic peripheral neuropathy) negative psych ROS   GI/Hepatic neg GERD  ,(+)     (-) substance abuse  ,   Endo/Other  neg diabetesHypothyroidism   Renal/GU CRFRenal disease     Musculoskeletal   Abdominal   Peds  Hematology   Anesthesia Other Findings   Reproductive/Obstetrics                          Anesthesia Physical Anesthesia Plan  ASA: III and emergent  Anesthesia Plan: Spinal   Post-op Pain Management:    Induction:   PONV Risk Score and Plan:   Airway Management Planned:   Additional Equipment:   Intra-op Plan:   Post-operative Plan:   Informed Consent: I have reviewed the patients History and Physical, chart, labs and discussed the procedure including the risks, benefits and alternatives for the proposed anesthesia with the patient or authorized representative who has indicated his/her understanding and acceptance.       Plan Discussed with:   Anesthesia Plan Comments:         Anesthesia Quick Evaluation

## 2020-08-31 NOTE — Progress Notes (Addendum)
PROGRESS NOTE    Mark Jimenez  ZYS:[id number] DOB: [birth year] DOA: 08/30/2020 [facility]    Assessment & Plan:   Principal Problem:   Transcervical fracture of left femur, closed, initial encounter (Mark Jimenez) Active Problems:   Chronic gouty arthropathy with tophus (tophi)   Idiopathic peripheral neuropathy   Accidental fall   Preoperative clearance   Osteopenia determined by x-ray   Closed left hip fracture, initial encounter (Mark Jimenez)   Acute kidney injury superimposed on CKD (Mark Jimenez)   Closed fracture of lumbar vertebral body (HCC)   Blood pressure elevated without history of HTN    Mark Jimenez is a 69 y.o. male with medical history significant for tophaceous gout, peripheral neuropathy, hypothyroidism who presents to the emergency room following a fall in which his foot caught on something as he turned around while standing causing him to fall onto his left side.  Since the fall he has been having intractable pain in his left hip and low back.  He denied hitting his head and had no loss of consciousness and denies other injury.  He was previously in his usual state of health.     Transcervical fracture of left femur, closed, initial encounter  S/p left TOTAL HIP ARTHROPLASTY on 08/31/20 -Patient sustained accidental fall after he tripped while turning around in room.  Had no preceding symptoms -X-ray showing transcervical fracture of the left femur as well as osteopenia PLAN: --OR today, tolerated well -Pain management, per ortho --PT  Suspect Acute closed fracture of L1 vertebral body (HCC) Multiple compression deformities of the thoracolumbar spine. -CT lumbar spine shows above -Management per Ortho, Dr. Rudene Christians --pain management, per ortho    Osteopenia determined by x-ray  Suspect acute kidney injury superimposed on CKD (Mark Jimenez) -Creatinine 1.96 but no recent creatinine on file.  Cr trended down to 1.64 next day with IVF. --d/c MIVF and encourage oral  hydration  Elevated BP -Patient with no prior history of hypertension with blood pressure 200/103 in the ER, likely due to pain.  Post-op, BP down to 100's-120's. --IV labetalol PRN    Chronic gouty arthropathy with tophus (tophi) -Prior documentation of intolerance to allopurinol -Colchicine and prednisone as needed    Idiopathic peripheral neuropathy -Appears stable     DVT prophylaxis: Lovenox SQ Code Status: Full code  Family Communication:  Status is: inpatient Dispo:   The patient is from: home Anticipated d/c is to: SNF rehab Anticipated d/c date is: 2-3 days Patient currently is not medically stable to d/c due to: just post-op, still need PT eval   Subjective and Interval History:  Pt had left TOTAL HIP ARTHROPLASTY today, tolerated it well.   Pt complained more of back pain, which is chronic and intermittent, relating to "nerve compression", per pt.     Objective: Vitals:   08/31/20 1157 08/31/20 1330 08/31/20 1431 08/31/20 1532  BP: 116/90 (!) 102/59 (!) 107/54 (!) 114/53  Pulse: 81 86 71 82  Resp: 16 15 15 18   Temp: (!) 97.4 F (36.3 C) 97.6 F (36.4 C) 98.1 F (36.7 C) 97.6 F (36.4 C)  TempSrc: Oral Oral Oral Oral  SpO2: 100% 100% 92% 100%  Weight:      Height:        Intake/Output Summary (Last 24 hours) at 08/31/2020 1842 Last data filed at 08/31/2020 1839 Gross per 24 hour  Intake 2174.07 ml  Output 1225 ml  Net 949.07 ml   Filed Weights   08/30/20 1741  Weight: 77.1 kg  Examination:   Constitutional: NAD, AAOx3 HEENT: conjunctivae and lids normal, EOMI CV: No cyanosis.   RESP: normal respiratory effort, on RA Extremities: No effusions, edema in BLE SKIN: warm, dry.  Gout nodules around fingers and elbow. Neuro: II - XII grossly intact.   Psych: Normal mood and affect.     Data Reviewed: I have personally reviewed following labs and imaging studies  CBC: Recent Labs  Lab 08/30/20 1756 08/31/20 0708  WBC 6.7 4.4   NEUTROABS 5.7  --   HGB 10.6* 9.5*  HCT 29.9* 27.2*  MCV 100.3* 101.5*  PLT 165 240*   Basic Metabolic Panel: Recent Labs  Lab 08/30/20 1756 08/31/20 0708  NA 135 136  K 3.7 3.5  CL 101 104  CO2 18* 20*  GLUCOSE 115* 82  BUN 26* 23  CREATININE 1.96* 1.64*  CALCIUM 8.9 8.8*   GFR: Estimated Creatinine Clearance: 45.1 mL/min (A) (by C-G formula based on SCr of 1.64 mg/dL (H)). Liver Function Tests: Recent Labs  Lab 08/30/20 1756  AST 46*  ALT 38  ALKPHOS 101  BILITOT 3.0*  PROT 8.1  ALBUMIN 3.6   No results for input(s): LIPASE, AMYLASE in the last 168 hours. No results for input(s): AMMONIA in the last 168 hours. Coagulation Profile: Recent Labs  Lab 08/30/20 1756  INR 1.0   Cardiac Enzymes: No results for input(s): CKTOTAL, CKMB, CKMBINDEX, TROPONINI in the last 168 hours. BNP (last 3 results) No results for input(s): PROBNP in the last 8760 hours. HbA1C: No results for input(s): HGBA1C in the last 72 hours. CBG: No results for input(s): GLUCAP in the last 168 hours. Lipid Profile: No results for input(s): CHOL, HDL, LDLCALC, TRIG, CHOLHDL, LDLDIRECT in the last 72 hours. Thyroid Function Tests: No results for input(s): TSH, T4TOTAL, FREET4, T3FREE, THYROIDAB in the last 72 hours. Anemia Panel: No results for input(s): VITAMINB12, FOLATE, FERRITIN, TIBC, IRON, RETICCTPCT in the last 72 hours. Sepsis Labs: No results for input(s): PROCALCITON, LATICACIDVEN in the last 168 hours.  Recent Results (from the past 240 hour(s))  Respiratory Panel by RT PCR (Flu A&B, Covid) - Nasopharyngeal Swab     Status: None   Collection Time: 08/30/20 10:30 PM   Specimen: Nasopharyngeal Swab  Result Value Ref Range Status   SARS Coronavirus 2 by RT PCR NEGATIVE NEGATIVE Final    Comment: (NOTE) SARS-CoV-2 target nucleic acids are NOT DETECTED.  The SARS-CoV-2 RNA is generally detectable in upper respiratoy specimens during the acute phase of infection. The  lowest concentration of SARS-CoV-2 viral copies this assay can detect is 131 copies/mL. A negative result does not preclude SARS-Cov-2 infection and should not be used as the sole basis for treatment or other patient management decisions. A negative result may occur with  improper specimen collection/handling, submission of specimen other than nasopharyngeal swab, presence of viral mutation(s) within the areas targeted by this assay, and inadequate number of viral copies (<131 copies/mL). A negative result must be combined with clinical observations, patient history, and epidemiological information. The expected result is Negative.  Fact Sheet for Patients:  PinkCheek.be  Fact Sheet for Healthcare Providers:  GravelBags.it  This test is no t yet approved or cleared by the [place] FDA and  has been authorized for detection and/or diagnosis of SARS-CoV-2 by FDA under an Emergency Use Authorization (EUA). This EUA will remain  in effect (meaning this test can be used) for the duration of the COVID-19 declaration under Section 564(b)(1) of the Act, 21  U.S.C. section 360bbb-3(b)(1), unless the authorization is terminated or revoked sooner.     Influenza A by PCR NEGATIVE NEGATIVE Final   Influenza B by PCR NEGATIVE NEGATIVE Final    Comment: (NOTE) The Xpert Xpress SARS-CoV-2/FLU/RSV assay is intended as an aid in  the diagnosis of influenza from Nasopharyngeal swab specimens and  should not be used as a sole basis for treatment. Nasal washings and  aspirates are unacceptable for Xpert Xpress SARS-CoV-2/FLU/RSV  testing.  Fact Sheet for Patients: PinkCheek.be  Fact Sheet for Healthcare Providers: GravelBags.it  This test is not yet approved or cleared by the [place] FDA and  has been authorized for detection and/or diagnosis of SARS-CoV-2 by  FDA under  an Emergency Use Authorization (EUA). This EUA will remain  in effect (meaning this test can be used) for the duration of the  Covid-19 declaration under Section 564(b)(1) of the Act, 21  U.S.C. section 360bbb-3(b)(1), unless the authorization is  terminated or revoked. Performed at [facility], [address] [place], [place] [address]       Radiology Studies: DG Chest 1 View  Result Date: 08/30/2020 CLINICAL DATA:  Pain status post fall EXAM: CHEST  1 VIEW COMPARISON:  None. FINDINGS: The heart size and mediastinal contours are within normal limits. Both lungs are clear. The visualized skeletal structures are unremarkable. IMPRESSION: No active disease. Electronically Signed   By: Constance Holster M.D.   On: 08/30/2020 20:21   DG Lumbar Spine 2-3 Views  Result Date: 08/30/2020 CLINICAL DATA:  Fall with low back pain EXAM: LUMBAR SPINE - 2-3 VIEW COMPARISON:  None. FINDINGS: Mild diffuse gaseous prominence of the bowel. 5 non rib-bearing lumbar type vertebra. Mild dextroscoliosis. Sagittal alignment is within normal limits. Mild to moderate diffuse degenerative changes throughout the lumbar spine. Possible fracture through the anterior inferior L1 vertebral body. Age indeterminate but suspect mild chronic compression deformity at L2 and superior endplate of L3. IMPRESSION: 1. Possible acute fracture through the anterior inferior L1 vertebral body. Suggest CT for further evaluation. 2. Mild compression deformity at L2 and superior endplate L3, possibly chronic. Electronically Signed   By: Donavan Foil M.D.   On: 08/30/2020 20:16   CT Head Wo Contrast  Result Date: 08/30/2020 CLINICAL DATA:  Head trauma history of fall EXAM: CT HEAD WITHOUT CONTRAST TECHNIQUE: Contiguous axial images were obtained from the base of the skull through the vertex without intravenous contrast. COMPARISON:  None. FINDINGS: Brain: No acute territorial infarction, hemorrhage or intracranial mass.  Moderate atrophy. Nondilated ventricles. Vascular: No hyperdense vessels.  Carotid vascular calcification Skull: Normal. Negative for fracture or focal lesion. Sinuses/Orbits: Mild mucosal thickening in the sinuses Other: None IMPRESSION: 1. No CT evidence for acute intracranial abnormality. 2. Atrophy. Electronically Signed   By: Donavan Foil M.D.   On: 08/30/2020 21:52   CT Lumbar Spine Wo Contrast  Result Date: 08/30/2020 CLINICAL DATA:  Low back pain, fall EXAM: CT LUMBAR SPINE WITHOUT CONTRAST TECHNIQUE: Multidetector CT imaging of the lumbar spine was performed without intravenous contrast administration. Multiplanar CT image reconstructions were also generated. COMPARISON:  Radiograph 08/30/2020 FINDINGS: Segmentation: For the purposes of reporting, first non rib-bearing lumbar vertebra will be designated L1. Small rudimentary disc at S1-S2. Alignment: Mild dextroscoliosis.  Sagittal alignment is normal. Vertebrae: Suspected acute inferior endplate fracture at L1 that extends to the anterior inferior vertebral body. Mild superior endplate deformities at L2, L3, and T12. Endplate deformity at L2 is of uncertain age. Less than  20% loss of height of anterior vertebral body. Schmorl's node associated with L3 and T12 superior endplate deformities. Paraspinal and other soft tissues: No suspicious paravertebral or paraspinal soft tissue abnormality. Disc levels: At T12-L1, maintained disc space.  Foramen are patent bilaterally At L1-L2, maintained disc space. No high-grade canal stenosis. Mild facet degenerative changes. No high-grade foraminal narrowing. At L2-L3 broad-based disc bulge. Mild canal stenosis. Facet degenerative changes. No high-grade foraminal narrowing. At L3-L4, mild vacuum discs. Moderate broad-based disc bulge. Mild canal stenosis. Facet degenerative changes. No high-grade foraminal narrowing. At L4-L5, vacuum discs. Mild broad-based disc bulge. No high-grade canal stenosis. Ligamentum  flavum thickening and facet degenerative change. Mild left foraminal narrowing. At L5-S1, mild disc space narrowing. Mild broad-based disc bulge. No significant canal stenosis. Facet degenerative changes. Mild bilateral foraminal narrowing. IMPRESSION: 1. Multiple compression deformities of the thoracolumbar spine. Suspect acute inferior endplate fracture at L1 that involves the anterior vertebral body. Age indeterminate mild superior endplate deformity at L2. Possible chronic superior endplate deformities at T12 and L3. MRI could be obtained to better evaluate for fracture acuity. 2. Multiple level degenerative changes throughout the lumbar spine. Electronically Signed   By: Donavan Foil M.D.   On: 08/30/2020 22:16   US Venous Img Lower Unilateral Left  Result Date: 08/30/2020 CLINICAL DATA:  Pain EXAM: RIGHT LOWER EXTREMITY VENOUS DOPPLER ULTRASOUND TECHNIQUE: Gray-scale sonography with compression, as well as color and duplex ultrasound, were performed to evaluate the deep venous system(s) from the level of the common femoral vein through the popliteal and proximal calf veins. COMPARISON:  None. FINDINGS: VENOUS Normal compressibility of the common femoral, superficial femoral, and popliteal veins, as well as the visualized calf veins. Visualized portions of profunda femoral vein and great saphenous vein unremarkable. No filling defects to suggest DVT on grayscale or color Doppler imaging. Doppler waveforms show normal direction of venous flow, normal respiratory plasticity and response to augmentation. Limited views of the contralateral common femoral vein are unremarkable. OTHER There is a right-sided Baker cyst measuring 4.3 x 0.8 cm. Limitations: none IMPRESSION: 1. No DVT. 2. Right-sided Baker's cyst. Electronically Signed   By: Constance Holster M.D.   On: 08/30/2020 19:42   DG Knee Complete 4 Views Left  Result Date: 08/30/2020 CLINICAL DATA:  Pain status post fall. EXAM: LEFT KNEE - COMPLETE  4+ VIEW COMPARISON:  None. FINDINGS: There are moderate tricompartmental degenerative changes. There is no large joint effusion. There is no acute displaced fracture or dislocation. Vascular calcifications are noted. There is prepatellar soft tissue swelling. De IMPRESSION: 1. No acute displaced fracture or dislocation. 2. Moderate tricompartmental degenerative changes. 3. Prepatellar soft tissue swelling. Electronically Signed   By: Constance Holster M.D.   On: 08/30/2020 20:17   DG HIP OPERATIVE UNILAT WITH PELVIS LEFT  Result Date: 08/31/2020 CLINICAL DATA:  Portable operative imaging performed for left hip arthroplasty. EXAM: OPERATIVE LEFT HIP (WITH PELVIS IF PERFORMED) 8 VIEWS TECHNIQUE: Fluoroscopic spot image(s) were submitted for interpretation post-operatively. COMPARISON:  08/30/2020 FINDINGS: Images show the removal of the left femoral head and neck, and placement of a left hip arthroplasty, which appears well seated and aligned. IMPRESSION: Operative imaging provided for left hip replacement. Electronically Signed   By: Lajean Manes M.D.   On: 08/31/2020 11:46   DG HIP UNILAT W OR W/O PELVIS 2-3 VIEWS LEFT  Result Date: 08/31/2020 CLINICAL DATA:  Postop left total hip arthroplasty. EXAM: DG HIP (WITH OR WITHOUT PELVIS) 2-3V LEFT COMPARISON:  Current operative  images.  Prior exam dated 08/30/2020. FINDINGS: New left hip arthroplasty appears well seated and aligned. No acute fracture or evidence of an operative complication. IMPRESSION: Well-positioned left hip arthroplasty. No evidence of an operative complication. Electronically Signed   By: Lajean Manes M.D.   On: 08/31/2020 11:43   DG Hip Unilat W or Wo Pelvis 2-3 Views Left  Result Date: 08/30/2020 CLINICAL DATA:  Pain status post fall EXAM: DG HIP (WITH OR WITHOUT PELVIS) 2-3V RIGHT; DG HIP (WITH OR WITHOUT PELVIS) 2-3V LEFT COMPARISON:  None. FINDINGS: There is no acute displaced fracture or dislocation involving the right hip.  There is a probable cam type deformity. Mild degenerative changes are noted. There is an acute, displaced transcervical fracture of the proximal left femur. There is osteopenia. IMPRESSION: 1. Acute, displaced transcervical fracture of the proximal left femur. 2. No acute displaced fracture or dislocation of the right hip. 3. Osteopenia. Electronically Signed   By: Constance Holster M.D.   On: 08/30/2020 20:20   DG Hip Unilat W or Wo Pelvis 2-3 Views Right  Result Date: 08/30/2020 CLINICAL DATA:  Pain status post fall EXAM: DG HIP (WITH OR WITHOUT PELVIS) 2-3V RIGHT; DG HIP (WITH OR WITHOUT PELVIS) 2-3V LEFT COMPARISON:  None. FINDINGS: There is no acute displaced fracture or dislocation involving the right hip. There is a probable cam type deformity. Mild degenerative changes are noted. There is an acute, displaced transcervical fracture of the proximal left femur. There is osteopenia. IMPRESSION: 1. Acute, displaced transcervical fracture of the proximal left femur. 2. No acute displaced fracture or dislocation of the right hip. 3. Osteopenia. Electronically Signed   By: Constance Holster M.D.   On: 08/30/2020 20:20     Scheduled Meds: . Chlorhexidine Gluconate Cloth  6 each Topical Daily  . docusate sodium  100 mg Oral BID  . [START ON 09/01/2020] enoxaparin (LOVENOX) injection  40 mg Subcutaneous Q24H  . feeding supplement  237 mL Oral BID BM  . nutrition supplement (JUVEN)  1 packet Oral BID BM  . pantoprazole  40 mg Oral BID   Continuous Infusions: . sodium chloride 100 mL/hr at 08/31/20 0500  . sodium chloride 100 mL/hr at 08/31/20 1245  .  ceFAZolin (ANCEF) IV 2 g (08/31/20 1527)  . methocarbamol (ROBAXIN) IV    . tranexamic acid       LOS: 1 day     Enzo Bi, MD [facility] If 7PM-7AM, please contact night-coverage 08/31/2020, 6:42 PM

## 2020-09-01 ENCOUNTER — Encounter: Payer: Self-pay | Admitting: Orthopedic Surgery

## 2020-09-01 LAB — URIC ACID: Uric Acid, Serum: 9 mg/dL — ABNORMAL HIGH (ref 3.7–8.6)

## 2020-09-01 LAB — BASIC METABOLIC PANEL
Anion gap: 9 (ref 5–15)
BUN: 19 mg/dL (ref 8–23)
CO2: 21 mmol/L — ABNORMAL LOW (ref 22–32)
Calcium: 8.1 mg/dL — ABNORMAL LOW (ref 8.9–10.3)
Chloride: 102 mmol/L (ref 98–111)
Creatinine, Ser: 1.54 mg/dL — ABNORMAL HIGH (ref 0.61–1.24)
GFR, Estimated: 48 mL/min — ABNORMAL LOW (ref >60)
Glucose, Bld: 105 mg/dL — ABNORMAL HIGH (ref 70–99)
Potassium: 3.1 mmol/L — ABNORMAL LOW (ref 3.5–5.1)
Sodium: 132 mmol/L — ABNORMAL LOW (ref 135–145)

## 2020-09-01 LAB — MAGNESIUM: Magnesium: 1.4 mg/dL — ABNORMAL LOW (ref 1.7–2.4)

## 2020-09-01 LAB — CBC
HCT: 21.9 % — ABNORMAL LOW (ref 39.0–52.0)
Hemoglobin: 7.6 g/dL — ABNORMAL LOW (ref 13.0–17.0)
MCH: 34.7 pg — ABNORMAL HIGH (ref 26.0–34.0)
MCHC: 34.7 g/dL (ref 30.0–36.0)
MCV: 100 fL (ref 80.0–100.0)
Platelets: 140 10*3/uL — ABNORMAL LOW (ref 150–400)
RBC: 2.19 MIL/uL — ABNORMAL LOW (ref 4.22–5.81)
RDW: 14.3 % (ref 11.5–15.5)
WBC: 4.4 10*3/uL (ref 4.0–10.5)
nRBC: 0 % (ref 0.0–0.2)

## 2020-09-01 MED ORDER — FE FUMARATE-B12-VIT C-FA-IFC PO CAPS
1.0000 | ORAL_CAPSULE | Freq: Two times a day (BID) | ORAL | Status: DC
  Administered 2020-09-01 – 2020-09-04 (×6): 1 via ORAL
  Filled 2020-09-01 (×7): qty 1

## 2020-09-01 MED ORDER — POTASSIUM CHLORIDE CRYS ER 20 MEQ PO TBCR
40.0000 meq | EXTENDED_RELEASE_TABLET | Freq: Once | ORAL | Status: AC
  Administered 2020-09-01: 40 meq via ORAL
  Filled 2020-09-01: qty 2

## 2020-09-01 MED ORDER — MAGNESIUM SULFATE 2 GM/50ML IV SOLN
2.0000 g | INTRAVENOUS | Status: AC
  Administered 2020-09-01 (×2): 2 g via INTRAVENOUS
  Filled 2020-09-01 (×2): qty 50

## 2020-09-01 MED ORDER — POTASSIUM CHLORIDE CRYS ER 20 MEQ PO TBCR: 20.0000 meq | EXTENDED_RELEASE_TABLET | Freq: Two times a day (BID) | ORAL | Status: DC

## 2020-09-01 NOTE — Evaluation (Signed)
Occupational Therapy Evaluation Patient Details Name: Mark Jimenez MRN: [id number] DOB: 09-05-1951 Today's Date: 09/01/2020    History of Present Illness Pt is a 69 y/o M admitted on 08/30/20 s/p fall onto his L side now with intractable pain in L hip & low back. X ray revealed transcervical fx of L hip, xray then CT of lumbar spine suspect for acute inferior endplate fx at L1 that involves anterior vertebral body. With regard to lumbar fx, if it limits his ability to ambulate pt/ortho to consider kyphoplasty. Pt now s/p L THA (anterior approach) on 08/31/20. PMH: gout, peripheral neuropathy, hypothyroidism   Clinical Impression   Mr Borak was seen for OT evaluation this date. Prior to hospital admission, pt was MOD I for ADLs and mobility using RW or walking stick. Pt lives c wife who has MS in 1 level home c 3 STE. Pt presents to acute OT demonstrating impaired ADL performance and functional mobility 2/2 pain, functional strength/ROM/balance deficits, and decreased activity tolerance. Pt defers mobility reporting 10/10 pain - RN notified. Pt requests B rails up - able to adjust torso position and minimally shift hips. SETUP self-drinking at bed level. TOTAL A don B socks at bed level. ANticipate MAX A x2 for toileting at bed level. Pt reports arthritis & RA in BUE; DIP/PIP flexion limited and pt with enlarged joints in fingers. Pt would benefit from skilled OT to address noted impairments and functional limitations (see below for any additional details) in order to maximize safety and independence while minimizing falls risk and caregiver burden. Upon hospital discharge, recommend STR to maximize pt safety and return to PLOF.     Follow Up Recommendations  SNF    Equipment Recommendations  Other (comment) (TBD)    Recommendations for Other Services       Precautions / Restrictions Precautions Precautions: Fall Precaution Comments: (anterior THA, no precautions) Restrictions Weight  Bearing Restrictions: Yes LLE Weight Bearing: Weight bearing as tolerated      Mobility Bed Mobility Overal bed mobility: Needs Assistance   General bed mobility comments: Pt defers 2/2 10/10 pain. Pt requests B rails up for bed mobility - able to adjust torso position    Transfers Overall transfer level:      General transfer comment: not attempted    Balance Overall balance assessment: Needs assistance;Mild deficits observed, not formally tested Sitting-balance support: Bilateral upper extremity supported;Feet supported Sitting balance-Leahy Scale: Fair           ADL either performed or assessed with clinical judgement   ADL Overall ADL's : Needs assistance/impaired          General ADL Comments: SETUP self-drinking at bed level. TOTAL A don B socks at bed level. ANticipate MAX A x2 for toileting at bed level                  Pertinent Vitals/Pain Pain Assessment: 0-10 Pain Score: 10-Worst pain ever Pain Location: L groin/thigh, back, L shoulder Pain Descriptors / Indicators: Squeezing;Grimacing;Guarding Pain Intervention(s): Limited activity within patient's tolerance;Repositioned;Patient requesting pain meds-RN notified     Hand Dominance Right   Extremity/Trunk Assessment Upper Extremity Assessment Upper Extremity Assessment: RUE deficits/detail;LUE deficits/detail RUE Deficits / Details: AROM grossly WFL, strength 4/5. DIP/PIP flexion limited. Pt reports arthritis & RA, pt with enlarged joints in fingers LUE Deficits / Details: Endorses LUE shoudler pain. AROM shoulder flexion ~80*. Grip 3+/5 - unable to incorporate 4th/5th digits into grip LUE: Unable to fully assess due to pain  Lower Extremity Assessment Lower Extremity Assessment: Defer to PT evaluation (unable assess 2/2 pain. limited L DF - pain to light touch)       Communication Communication Communication: No difficulties   Cognition Arousal/Alertness: Awake/alert Behavior During  Therapy: WFL for tasks assessed/performed Overall Cognitive Status: Within Functional Limits for tasks assessed        General Comments: talkative and c/o pain at rest   General Comments       Exercises Exercises: Other exercises Other Exercises Other Exercises: Pt educated re: OT role, DME recs, falls prevention, ECS, pain mgmt, importanc eof mobility for strengthening Other Exercises: Self-feeding, LBD   Shoulder Instructions      Home Living Family/patient expects to be discharged to:: Private residence Living Arrangements: Spouse/significant other Available Help at Discharge: Family (wife has MS, unable to assist) Type of Home: House Home Access: Stairs to enter Technical brewer of Steps: 3 Entrance Stairs-Rails: Left Home Layout: One level         Bathroom Toilet: [place]: Walker - 2 wheels   Additional Comments: uses a rake handle as a walking stick      Prior Functioning/Environment Level of Independence: Independent with assistive device(s)        Comments: uses walking stick during the day, RW at night. Reports son is looking for an accessible house        OT Problem List: Decreased strength;Decreased range of motion;Decreased activity tolerance;Impaired balance (sitting and/or standing);Decreased knowledge of use of DME or AE;Pain      OT Treatment/Interventions: Self-care/ADL training;Therapeutic exercise;Energy conservation;DME and/or AE instruction;Therapeutic activities;Patient/family education;Balance training    OT Goals(Current goals can be found in the care plan section) Acute Rehab OT Goals Patient Stated Goal: to get better OT Goal Formulation: With patient Time For Goal Achievement: 09/15/20 Potential to Achieve Goals: Good ADL Goals Pt Will Perform Grooming: sitting;with set-up;with supervision Pt Will Perform Lower Body Dressing: sitting/lateral leans;with mod assist Pt Will Transfer to Toilet: with mod  assist;stand pivot transfer;bedside commode (c LRAD PRN)  OT Frequency: Min 1X/week   Barriers to D/C: Inaccessible home environment;Decreased caregiver support             AM-PAC OT "6 Clicks" Daily Activity     Outcome Measure Help from another person eating meals?: None Help from another person taking care of personal grooming?: A Little Help from another person toileting, which includes using toliet, bedpan, or urinal?: A Lot Help from another person bathing (including washing, rinsing, drying)?: A Lot Help from another person to put on and taking off regular upper body clothing?: A Little Help from another person to put on and taking off regular lower body clothing?: Total 6 Click Score: 15   End of Session Nurse Communication: Patient requests pain meds  Activity Tolerance: Patient limited by pain Patient left: in bed;with call bell/phone within reach;with SCD's reapplied  OT Visit Diagnosis: Other abnormalities of gait and mobility (R26.89);Muscle weakness (generalized) (M62.81)                Time: 1607-3710 OT Time Calculation (min): 15 min Charges:  OT General Charges $OT Visit: 1 Visit OT Evaluation $OT Eval Low Complexity: 1 Low OT Treatments $Self Care/Home Management : 8-22 mins  Dessie Coma, M.S. OTR/L  09/01/20, 2:47 PM  ascom [phone]

## 2020-09-01 NOTE — Progress Notes (Signed)
° °  Subjective: 1 Day Post-Op Procedure(s) (LRB): TOTAL HIP ARTHROPLASTY ANTERIOR APPROACH (Left) Patient reports pain as mild.   Patient is well, and has had no acute complaints or problems.  Patient does complain of mild pain in both ankles, has a history of chronic swelling of both ankles. Denies any CP, SOB, ABD pain. We will start physical therapy today.  Objective: Vital signs in last 24 hours: Temp:  [97.4 F (36.3 C)-99.2 F (37.3 C)] 99.2 F (37.3 C) (10/31 0325) Pulse Rate:  [71-93] 93 (10/31 0325) Resp:  [12-20] 17 (10/31 0325) BP: (102-135)/(53-90) 130/64 (10/31 0325) SpO2:  [92 %-100 %] 100 % (10/31 0325) Weight:  [81.6 kg] 81.6 kg (10/31 0645)  Intake/Output from previous day: 10/30 0701 - 10/31 0700 In: 1720.7 [P.O.:50; I.V.:1470.7; IV Piggyback:200] Out: 8099 [Urine:1400; Blood:250] Intake/Output this shift: No intake/output data recorded.  Recent Labs    08/30/20 1756 08/31/20 0708 09/01/20 0258  HGB 10.6* 9.5* 7.6*   Recent Labs    08/31/20 0708 09/01/20 0258  WBC 4.4 4.4  RBC 2.68* 2.19*  HCT 27.2* 21.9*  PLT 142* 140*   Recent Labs    08/31/20 0708 09/01/20 0258  NA 136 132*  K 3.5 3.1*  CL 104 102  CO2 20* 21*  BUN 23 19  CREATININE 1.64* 1.54*  GLUCOSE 82 105*  CALCIUM 8.8* 8.1*   Recent Labs    08/30/20 1756  INR 1.0    EXAM General - Patient is Alert, Appropriate and Oriented Extremity - Neurovascular intact Sensation intact distally Intact pulses distally Incision: dressing C/D/I and no drainage No cellulitis present Compartment soft Mild swelling and tenderness to both ankles.  Patient is able to slightly ankle plantarflex and dorsiflex bilaterally Dressing - dressing C/D/I and no drainage Praveena intact without drainage Motor Function - intact, moving foot and toes well on exam.   Past Medical History:  Diagnosis Date   Chronic tophaceous gout    Peripheral neuropathy     Assessment/Plan:   1 Day Post-Op  Procedure(s) (LRB): TOTAL HIP ARTHROPLASTY ANTERIOR APPROACH (Left) Principal Problem:   Transcervical fracture of left femur, closed, initial encounter ([place]) Active Problems:   Chronic gouty arthropathy with tophus (tophi)   Idiopathic peripheral neuropathy   Accidental fall   Preoperative clearance   Osteopenia determined by x-ray   Closed left hip fracture, initial encounter ([place])   Acute kidney injury superimposed on CKD (HCC)   Closed fracture of lumbar vertebral body (HCC)   Blood pressure elevated without history of HTN  Estimated body mass index is 24.41 kg/m as calculated from the following:   Height as of this encounter: 6' (1.829 m).   Weight as of this encounter: 81.6 kg. Advance diet Up with therapy  Work on bowel movement Patient refusing SCDs.  Can try foot pumps. Acute postop blood loss anemia -hemoglobin 7.6.  Recheck hemoglobin tomorrow if below 7, recommend transfusion. Care management to assist with discharge.   DVT Prophylaxis - Lovenox, TED hose and SCDs Weight-Bearing as tolerated to left leg   T. Rachelle Hora, PA-C [place] 09/01/2020, 9:04 AM

## 2020-09-01 NOTE — Plan of Care (Signed)

## 2020-09-01 NOTE — Progress Notes (Signed)
Patient refusing to wear SCDs. He states they cause his legs and ankles to hurt more than they do already with his gout condition.

## 2020-09-01 NOTE — Anesthesia Postprocedure Evaluation (Signed)
Anesthesia Post Note  Patient: Mark Jimenez  Procedure(s) Performed: TOTAL HIP ARTHROPLASTY ANTERIOR APPROACH (Left Hip)  Patient location during evaluation: Nursing Unit Anesthesia Type: Spinal Level of consciousness: oriented and awake and alert Pain management: pain level controlled Vital Signs Assessment: post-procedure vital signs reviewed and stable Respiratory status: spontaneous breathing and respiratory function stable Cardiovascular status: stable Postop Assessment: no headache, no backache, no apparent nausea or vomiting and patient able to bend at knees Anesthetic complications: no   No complications documented.   Last Vitals:  Vitals:   08/31/20 2313 09/01/20 0325  BP: 133/61 130/64  Pulse: 91 93  Resp: 17 17  Temp: 36.9 C 37.3 C  SpO2: 100% 100%    Last Pain:  Vitals:   09/01/20 1031  TempSrc:   PainSc: 7                  Kenedie Dirocco K

## 2020-09-01 NOTE — TOC Initial Note (Signed)
Transition of Care [facility]) - Initial/Assessment Note    Patient Details  Name: Mark Jimenez MRN: [id number] Date of Birth: Jan 03, 1951  Transition of Care [facility]) CM/SW Contact:    Boris Sharper, LCSW Phone Number: 09/01/2020, 2:57 PM  Clinical Narrative:                 CSW contacted pt to discuss discharge plans and PT recommendations. CSW explained that PT recommended SNF and pt was not agreeable to SNF at this moment. Pt stated that he takes care of his wife who has MS and there is no one else to take care of her. CSW explained the option of HH and pt said he would like to see how he does while in the hospital and decide closer to discharge.  TOC will continue to follow   Expected Discharge Plan: Skilled Nursing Facility Barriers to Discharge: Continued Medical Work up   Patient Goals and CMS Choice Patient states their goals for this hospitalization and ongoing recovery are:: To get back to normal so he can take care of his wife CMS Medicare.gov Compare Post Acute Care list provided to:: Patient Choice offered to / list presented to : Patient  Expected Discharge Plan and Services Expected Discharge Plan: [place]       Living arrangements for the past 2 months: Single Family Home                                      Prior Living Arrangements/Services Living arrangements for the past 2 months: Single Family Home Lives with:: Spouse Patient language and need for interpreter reviewed:: Yes        Need for Family Participation in Patient Care: Yes (Comment) Care giver support system in place?: Yes (comment) (adult children)   Criminal Activity/Legal Involvement Pertinent to Current Situation/Hospitalization: No - Comment as needed  Activities of Daily Living Home Assistive Devices/Equipment: Environmental consultant (specify type) (2 wheel) ADL Screening (condition at time of admission) Patient's cognitive ability adequate to safely complete daily activities?:  Yes Is the patient deaf or have difficulty hearing?: No Does the patient have difficulty seeing, even when wearing glasses/contacts?: Yes Does the patient have difficulty concentrating, remembering, or making decisions?: No Patient able to express need for assistance with ADLs?: Yes Does the patient have difficulty dressing or bathing?: No Independently performs ADLs?: Yes (appropriate for developmental age) Does the patient have difficulty walking or climbing stairs?: Yes Weakness of Legs: Both Weakness of Arms/Hands: Both  Permission Sought/Granted Permission sought to share information with : Facility Art therapist granted to share information with : Yes, Verbal Permission Granted              Emotional Assessment Appearance:: Other (Comment Required (unable to assess) Attitude/Demeanor/Rapport: Unable to Assess Affect (typically observed): Unable to Assess Orientation: : Oriented to Self, Oriented to Place, Oriented to  Time, Oriented to Situation Alcohol / Substance Use: Not Applicable Psych Involvement: No (comment)  Admission diagnosis:  Fall [W19.XXXA] Left leg pain [M79.605] AKI (acute kidney injury) (No Name) [N17.9] Pre-op chest exam [G25.427] Tophaceous gout [M1A.9XX1] Displaced fracture of left femoral neck ([place]) [S72.002A] Fall, initial encounter [W19.XXXA] Closed left hip fracture, initial encounter ([place]) [S72.002A] Hip fx, left, closed, initial encounter [facility]) [S72.002A] Patient Active Problem List   Diagnosis Date Noted  . Transcervical fracture of left femur, closed, initial encounter ([place]) 08/30/2020  . Accidental fall 08/30/2020  .  Preoperative clearance 08/30/2020  . Osteopenia determined by x-ray 08/30/2020  . Closed left hip fracture, initial encounter ([place]) 08/30/2020  . Acute kidney injury superimposed on CKD ([place]) 08/30/2020  . Closed fracture of lumbar vertebral body ([place]) 08/30/2020  . Blood pressure elevated without history of  HTN 08/30/2020  . Acquired hypothyroidism 02/20/2016  . Idiopathic peripheral neuropathy 02/20/2016  . Anxiety 02/20/2016  . Chronic gouty arthropathy with tophus (tophi) 05/24/2012   PCP:  Healthcare, [place]:  No Pharmacies Listed    Social Determinants of Health (SDOH) Interventions    Readmission Risk Interventions No flowsheet data found.

## 2020-09-01 NOTE — Evaluation (Signed)
Physical Therapy Evaluation Patient Details Name: Macauley Mossberg MRN: [id number] DOB: 11/23/1950 Today's Date: 09/01/2020   History of Present Illness  Pt is a 69 y/o M admitted on 08/30/20 s/p fall onto his L side now with intractable pain in L hip & low back. X ray revealed transcervical fx of L hip, xray then CT of lumbar spine suspect for acute inferior endplate fx at L1 that involves anterior vertebral body. With regard to lumbar fx, if it limits his ability to ambulate pt/ortho to consider kyphoplasty. Pt now s/p L THA (anterior approach) on 08/31/20. PMH: gout, peripheral neuropathy, hypothyroidism  Clinical Impression  MD cleared pt for participation in PT session (pt also receiving K+ medication). Pt received in bed & agreeable to tx. Pt requires significant assistance for bed mobility as noted below. Pt is able to tolerate sitting EOB ~2 minutes before assisted back to bed with HOB elevated. Pt unable to tolerate lying flat and notes he wouldn't be able to get out of his bed at home at this current time. Pt with limited BLE (L>R) ankle ROM but performs AP's through available range, heel slides (AAROM), quad sets & glute sets for strengthening. Will continue to follow pt acutely to focus on bed mobility, transfers & gait as able. At this time recommending SNF upon d/c for additional therapy services.    Follow Up Recommendations SNF;Supervision/Assistance - 24 hour    Equipment Recommendations   (TBD in next venue)    Recommendations for Other Services       Precautions / Restrictions Precautions Precautions: Fall Precaution Comments: (anterior THA, no precautions) Restrictions Weight Bearing Restrictions: Yes LLE Weight Bearing: Weight bearing as tolerated      Mobility  Bed Mobility Overal bed mobility: Needs Assistance Bed Mobility: Rolling;Supine to Sit;Sit to Supine Rolling: +2 for physical assistance   Supine to sit: Max assist;HOB elevated Sit to supine: +2 for  physical assistance;HOB elevated   General bed mobility comments: pt reports low back pain but notes this is chronic (prior to fall), once sitting EOB pt reports dizziness but this slowly improves with rest & pt reports this happened PTA    Transfers Overall transfer level:  (deferred 2/2 pain, dizziness with sitting EOB)                  Ambulation/Gait                Stairs            Wheelchair Mobility    Modified Rankin (Stroke Patients Only)       Balance Overall balance assessment: Needs assistance;Mild deficits observed, not formally tested Sitting-balance support: Bilateral upper extremity supported;Feet supported Sitting balance-Leahy Scale: Fair                                       Pertinent Vitals/Pain Pain Assessment: Faces Faces Pain Scale: Hurts little more Pain Location: L groin/thigh Pain Descriptors / Indicators: Squeezing;Grimacing;Guarding Pain Intervention(s): Monitored during session;Limited activity within patient's tolerance (RN bringing pt pain meds during session)    Home Living Family/patient expects to be discharged to:: Private residence Living Arrangements: Spouse/significant other Available Help at Discharge: Family (pt's wife has MS & disabled using rollator, unable to provide assist to pt) Type of Home: House Home Access: Stairs to enter Entrance Stairs-Rails: Left Entrance Stairs-Number of Steps: 3 Home Layout: One level Home Equipment: Gilford Rile -  2 wheels Additional Comments: uses a rake handle as a walking stick    Prior Function Level of Independence: Independent with assistive device(s)         Comments: uses walking stick during the day, RW at night     Hand Dominance        Extremity/Trunk Assessment   Upper Extremity Assessment Upper Extremity Assessment: Generalized weakness (pt reports arthritis & RA, pt with enlarged joints in fingers, difficulty donning mask due to LUE  limitations with shoulder elevation)    Lower Extremity Assessment Lower Extremity Assessment: Generalized weakness;LLE deficits/detail LLE Deficits / Details: minimal ankle ROM (L>R), LLE limited by pain & weakness LLE Sensation: history of peripheral neuropathy LLE Coordination: decreased gross motor       Communication   Communication: No difficulties  Cognition Arousal/Alertness: Awake/alert Behavior During Therapy: WFL for tasks assessed/performed Overall Cognitive Status: Within Functional Limits for tasks assessed                                 General Comments: disgruntled      General Comments      Exercises     Assessment/Plan    PT Assessment Patient needs continued PT services  PT Problem List Decreased strength;Decreased balance;Decreased knowledge of precautions;Pain;Cardiopulmonary status limiting activity;Decreased knowledge of use of DME;Decreased mobility;Decreased range of motion;Decreased activity tolerance;Decreased coordination;Decreased safety awareness;Impaired sensation;Decreased skin integrity       PT Treatment Interventions DME instruction;Functional mobility training;Patient/family education;Therapeutic activities;Gait training;Neuromuscular re-education;Wheelchair mobility training;Balance training;Therapeutic exercise;Stair training;Manual techniques    PT Goals (Current goals can be found in the Care Plan section)  Acute Rehab PT Goals Patient Stated Goal: to get better PT Goal Formulation: With patient Time For Goal Achievement: 09/15/20 Potential to Achieve Goals: Fair    Frequency BID   Barriers to discharge Decreased caregiver support;Inaccessible home environment lives with disabled wife who is unable to physically assist pt at d/c, stairs to enter home    Co-evaluation               AM-PAC PT "6 Clicks" Mobility  Outcome Measure Help needed turning from your back to your side while in a flat bed without  using bedrails?: Total Help needed moving from lying on your back to sitting on the side of a flat bed without using bedrails?: Total Help needed moving to and from a bed to a chair (including a wheelchair)?: Total Help needed standing up from a chair using your arms (e.g., wheelchair or bedside chair)?: Total Help needed to walk in hospital room?: Total Help needed climbing 3-5 steps with a railing? : Total 6 Click Score: 6    End of Session Equipment Utilized During Treatment:  (wound vac on L groin) Activity Tolerance: Patient limited by pain Patient left: in bed;with bed alarm set;with nursing/sitter in room;with SCD's reapplied Nurse Communication: Mobility status PT Visit Diagnosis: Muscle weakness (generalized) (M62.81);Difficulty in walking, not elsewhere classified (R26.2);Other abnormalities of gait and mobility (R26.89)    Time: 0626-9485 PT Time Calculation (min) (ACUTE ONLY): 30 min   Charges:   PT Evaluation $PT Eval Moderate Complexity: 1 Mod PT Treatments $Therapeutic Activity: 8-22 mins        Lavone Nian, PT, DPT 09/01/20, 10:51 AM   Waunita Schooner 09/01/2020, 10:48 AM

## 2020-09-01 NOTE — Progress Notes (Signed)
PROGRESS NOTE    Mark Jimenez  NAT:[id number] DOB: 03-21-1951 DOA: 08/30/2020 [facility]    Assessment & Plan:   Principal Problem:   Transcervical fracture of left femur, closed, initial encounter ([place]) Active Problems:   Chronic gouty arthropathy with tophus (tophi)   Idiopathic peripheral neuropathy   Accidental fall   Preoperative clearance   Osteopenia determined by x-ray   Closed left hip fracture, initial encounter ([place])   Acute kidney injury superimposed on CKD ([place])   Closed fracture of lumbar vertebral body (HCC)   Blood pressure elevated without history of HTN    Mark Jimenez is a 69 y.o. male with medical history significant for tophaceous gout, peripheral neuropathy, hypothyroidism who presents to the emergency room following a fall in which his foot caught on something as he turned around while standing causing him to fall onto his left side.  Since the fall he has been having intractable pain in his left hip and low back.  He denied hitting his head and had no loss of consciousness and denies other injury.  He was previously in his usual state of health.     Transcervical fracture of left femur, closed, initial encounter  S/p left TOTAL HIP ARTHROPLASTY on 08/31/20 -Patient sustained accidental fall after he tripped while turning around in room.  Had no preceding symptoms -X-ray showing transcervical fracture of the left femur as well as osteopenia PLAN: -Pain management, per ortho --PT/OT rec SNF --weight-bearing as tolerated   Suspect Acute closed fracture of L1 vertebral body (HCC) Multiple compression deformities of the thoracolumbar spine. -CT lumbar spine shows above -Management per Ortho, Dr. Rudene Christians --pain management, per ortho  Post-op anemia, likely due to blood loss --Hgb dropped from 10.6 to 7.6 this morning --Monitor and transfuse to keep Hgb >7  Osteopenia determined by x-ray  Suspect acute kidney injury superimposed on CKD  (HCC) -Creatinine 1.96 but no recent creatinine on file.  Cr trended down to 1.64 next day with IVF. --Hold MIVF and encourage oral hydration  Elevated BP -Patient with no prior history of hypertension with blood pressure 200/103 in the ER, likely due to pain.  Post-op, BP down to 100's-120's. --IV labetalol PRN    Chronic gouty arthropathy with tophus (tophi) -Prior documentation of intolerance to allopurinol -Colchicine and prednisone as needed --refer to Rheum as outpatient    Idiopathic peripheral neuropathy -Appears stable     DVT prophylaxis: Lovenox SQ Code Status: Full code  Family Communication:  Status is: inpatient Dispo:   The patient is from: home Anticipated d/c is to: SNF rehab Anticipated d/c date is: whenever bed available Patient currently is medically stable to d/c.   Subjective and Interval History:  Hip pain controlled with current pain regimen.  Worked with PT/OT today.   Objective: Vitals:   08/31/20 1947 08/31/20 2313 09/01/20 0325 09/01/20 0645  BP: 135/69 133/61 130/64   Pulse: 93 91 93   Resp: 16 17 17    Temp: 98.5 F (36.9 C) 98.5 F (36.9 C) 99.2 F (37.3 C)   TempSrc: Oral Oral Oral   SpO2: 99% 100% 100%   Weight:    81.6 kg  Height:        Intake/Output Summary (Last 24 hours) at 09/01/2020 1145 Last data filed at 09/01/2020 0600 Gross per 24 hour  Intake 370.67 ml  Output 900 ml  Net -529.33 ml   Filed Weights   08/30/20 1741 09/01/20 0645  Weight: 77.1 kg 81.6 kg  Examination:   Constitutional: NAD, AAOx3 HEENT: conjunctivae and lids normal, EOMI CV: No cyanosis.   RESP: normal respiratory effort, on RA Extremities: No effusions, edema in BLE SKIN: warm, dry.  Numerous gout nodules all over his finger joints and elbow. Neuro: II - XII grossly intact.   Psych: Normal mood and affect.      Data Reviewed: I have personally reviewed following labs and imaging studies  CBC: Recent Labs  Lab 08/30/20 1756  08/31/20 0708 09/01/20 0258  WBC 6.7 4.4 4.4  NEUTROABS 5.7  --   --   HGB 10.6* 9.5* 7.6*  HCT 29.9* 27.2* 21.9*  MCV 100.3* 101.5* 100.0  PLT 165 142* 161*   Basic Metabolic Panel: Recent Labs  Lab 08/30/20 1756 08/31/20 0708 09/01/20 0258  NA 135 136 132*  K 3.7 3.5 3.1*  CL 101 104 102  CO2 18* 20* 21*  GLUCOSE 115* 82 105*  BUN 26* 23 19  CREATININE 1.96* 1.64* 1.54*  CALCIUM 8.9 8.8* 8.1*  MG  --   --  1.4*   GFR: Estimated Creatinine Clearance: 48.3 mL/min (A) (by C-G formula based on SCr of 1.54 mg/dL (H)). Liver Function Tests: Recent Labs  Lab 08/30/20 1756  AST 46*  ALT 38  ALKPHOS 101  BILITOT 3.0*  PROT 8.1  ALBUMIN 3.6   No results for input(s): LIPASE, AMYLASE in the last 168 hours. No results for input(s): AMMONIA in the last 168 hours. Coagulation Profile: Recent Labs  Lab 08/30/20 1756  INR 1.0   Cardiac Enzymes: No results for input(s): CKTOTAL, CKMB, CKMBINDEX, TROPONINI in the last 168 hours. BNP (last 3 results) No results for input(s): PROBNP in the last 8760 hours. HbA1C: No results for input(s): HGBA1C in the last 72 hours. CBG: No results for input(s): GLUCAP in the last 168 hours. Lipid Profile: No results for input(s): CHOL, HDL, LDLCALC, TRIG, CHOLHDL, LDLDIRECT in the last 72 hours. Thyroid Function Tests: No results for input(s): TSH, T4TOTAL, FREET4, T3FREE, THYROIDAB in the last 72 hours. Anemia Panel: No results for input(s): VITAMINB12, FOLATE, FERRITIN, TIBC, IRON, RETICCTPCT in the last 72 hours. Sepsis Labs: No results for input(s): PROCALCITON, LATICACIDVEN in the last 168 hours.  Recent Results (from the past 240 hour(s))  Respiratory Panel by RT PCR (Flu A&B, Covid) - Nasopharyngeal Swab     Status: None   Collection Time: 08/30/20 10:30 PM   Specimen: Nasopharyngeal Swab  Result Value Ref Range Status   SARS Coronavirus 2 by RT PCR NEGATIVE NEGATIVE Final    Comment: (NOTE) SARS-CoV-2 target nucleic acids  are NOT DETECTED.  The SARS-CoV-2 RNA is generally detectable in upper respiratoy specimens during the acute phase of infection. The lowest concentration of SARS-CoV-2 viral copies this assay can detect is 131 copies/mL. A negative result does not preclude SARS-Cov-2 infection and should not be used as the sole basis for treatment or other patient management decisions. A negative result may occur with  improper specimen collection/handling, submission of specimen other than nasopharyngeal swab, presence of viral mutation(s) within the areas targeted by this assay, and inadequate number of viral copies (<131 copies/mL). A negative result must be combined with clinical observations, patient history, and epidemiological information. The expected result is Negative.  Fact Sheet for Patients:  PinkCheek.be  Fact Sheet for Healthcare Providers:  GravelBags.it  This test is no t yet approved or cleared by the [place] FDA and  has been authorized for detection and/or diagnosis of SARS-CoV-2 by  FDA under an Emergency Use Authorization (EUA). This EUA will remain  in effect (meaning this test can be used) for the duration of the COVID-19 declaration under Section 564(b)(1) of the Act, 21 U.S.C. section 360bbb-3(b)(1), unless the authorization is terminated or revoked sooner.     Influenza A by PCR NEGATIVE NEGATIVE Final   Influenza B by PCR NEGATIVE NEGATIVE Final    Comment: (NOTE) The Xpert Xpress SARS-CoV-2/FLU/RSV assay is intended as an aid in  the diagnosis of influenza from Nasopharyngeal swab specimens and  should not be used as a sole basis for treatment. Nasal washings and  aspirates are unacceptable for Xpert Xpress SARS-CoV-2/FLU/RSV  testing.  Fact Sheet for Patients: PinkCheek.be  Fact Sheet for Healthcare Providers: GravelBags.it  This test is not  yet approved or cleared by the [place] FDA and  has been authorized for detection and/or diagnosis of SARS-CoV-2 by  FDA under an Emergency Use Authorization (EUA). This EUA will remain  in effect (meaning this test can be used) for the duration of the  Covid-19 declaration under Section 564(b)(1) of the Act, 21  U.S.C. section 360bbb-3(b)(1), unless the authorization is  terminated or revoked. Performed at [facility], [address], [place],  [address]       Radiology Studies: DG Chest 1 View  Result Date: 08/30/2020 CLINICAL DATA:  Pain status post fall EXAM: CHEST  1 VIEW COMPARISON:  None. FINDINGS: The heart size and mediastinal contours are within normal limits. Both lungs are clear. The visualized skeletal structures are unremarkable. IMPRESSION: No active disease. Electronically Signed   By: Constance Holster M.D.   On: 08/30/2020 20:21   DG Lumbar Spine 2-3 Views  Result Date: 08/30/2020 CLINICAL DATA:  Fall with low back pain EXAM: LUMBAR SPINE - 2-3 VIEW COMPARISON:  None. FINDINGS: Mild diffuse gaseous prominence of the bowel. 5 non rib-bearing lumbar type vertebra. Mild dextroscoliosis. Sagittal alignment is within normal limits. Mild to moderate diffuse degenerative changes throughout the lumbar spine. Possible fracture through the anterior inferior L1 vertebral body. Age indeterminate but suspect mild chronic compression deformity at L2 and superior endplate of L3. IMPRESSION: 1. Possible acute fracture through the anterior inferior L1 vertebral body. Suggest CT for further evaluation. 2. Mild compression deformity at L2 and superior endplate L3, possibly chronic. Electronically Signed   By: Donavan Foil M.D.   On: 08/30/2020 20:16   CT Head Wo Contrast  Result Date: 08/30/2020 CLINICAL DATA:  Head trauma history of fall EXAM: CT HEAD WITHOUT CONTRAST TECHNIQUE: Contiguous axial images were obtained from the base of the skull through the vertex  without intravenous contrast. COMPARISON:  None. FINDINGS: Brain: No acute territorial infarction, hemorrhage or intracranial mass. Moderate atrophy. Nondilated ventricles. Vascular: No hyperdense vessels.  Carotid vascular calcification Skull: Normal. Negative for fracture or focal lesion. Sinuses/Orbits: Mild mucosal thickening in the sinuses Other: None IMPRESSION: 1. No CT evidence for acute intracranial abnormality. 2. Atrophy. Electronically Signed   By: Donavan Foil M.D.   On: 08/30/2020 21:52   CT Lumbar Spine Wo Contrast  Result Date: 08/30/2020 CLINICAL DATA:  Low back pain, fall EXAM: CT LUMBAR SPINE WITHOUT CONTRAST TECHNIQUE: Multidetector CT imaging of the lumbar spine was performed without intravenous contrast administration. Multiplanar CT image reconstructions were also generated. COMPARISON:  Radiograph 08/30/2020 FINDINGS: Segmentation: For the purposes of reporting, first non rib-bearing lumbar vertebra will be designated L1. Small rudimentary disc at S1-S2. Alignment: Mild dextroscoliosis.  Sagittal alignment is normal. Vertebrae:  Suspected acute inferior endplate fracture at L1 that extends to the anterior inferior vertebral body. Mild superior endplate deformities at L2, L3, and T12. Endplate deformity at L2 is of uncertain age. Less than 20% loss of height of anterior vertebral body. Schmorl's node associated with L3 and T12 superior endplate deformities. Paraspinal and other soft tissues: No suspicious paravertebral or paraspinal soft tissue abnormality. Disc levels: At T12-L1, maintained disc space.  Foramen are patent bilaterally At L1-L2, maintained disc space. No high-grade canal stenosis. Mild facet degenerative changes. No high-grade foraminal narrowing. At L2-L3 broad-based disc bulge. Mild canal stenosis. Facet degenerative changes. No high-grade foraminal narrowing. At L3-L4, mild vacuum discs. Moderate broad-based disc bulge. Mild canal stenosis. Facet degenerative changes.  No high-grade foraminal narrowing. At L4-L5, vacuum discs. Mild broad-based disc bulge. No high-grade canal stenosis. Ligamentum flavum thickening and facet degenerative change. Mild left foraminal narrowing. At L5-S1, mild disc space narrowing. Mild broad-based disc bulge. No significant canal stenosis. Facet degenerative changes. Mild bilateral foraminal narrowing. IMPRESSION: 1. Multiple compression deformities of the thoracolumbar spine. Suspect acute inferior endplate fracture at L1 that involves the anterior vertebral body. Age indeterminate mild superior endplate deformity at L2. Possible chronic superior endplate deformities at T12 and L3. MRI could be obtained to better evaluate for fracture acuity. 2. Multiple level degenerative changes throughout the lumbar spine. Electronically Signed   By: Donavan Foil M.D.   On: 08/30/2020 22:16   US Venous Img Lower Unilateral Left  Result Date: 08/30/2020 CLINICAL DATA:  Pain EXAM: RIGHT LOWER EXTREMITY VENOUS DOPPLER ULTRASOUND TECHNIQUE: Gray-scale sonography with compression, as well as color and duplex ultrasound, were performed to evaluate the deep venous system(s) from the level of the common femoral vein through the popliteal and proximal calf veins. COMPARISON:  None. FINDINGS: VENOUS Normal compressibility of the common femoral, superficial femoral, and popliteal veins, as well as the visualized calf veins. Visualized portions of profunda femoral vein and great saphenous vein unremarkable. No filling defects to suggest DVT on grayscale or color Doppler imaging. Doppler waveforms show normal direction of venous flow, normal respiratory plasticity and response to augmentation. Limited views of the contralateral common femoral vein are unremarkable. OTHER There is a right-sided Baker cyst measuring 4.3 x 0.8 cm. Limitations: none IMPRESSION: 1. No DVT. 2. Right-sided Baker's cyst. Electronically Signed   By: Constance Holster M.D.   On: 08/30/2020 19:42    DG Knee Complete 4 Views Left  Result Date: 08/30/2020 CLINICAL DATA:  Pain status post fall. EXAM: LEFT KNEE - COMPLETE 4+ VIEW COMPARISON:  None. FINDINGS: There are moderate tricompartmental degenerative changes. There is no large joint effusion. There is no acute displaced fracture or dislocation. Vascular calcifications are noted. There is prepatellar soft tissue swelling. De IMPRESSION: 1. No acute displaced fracture or dislocation. 2. Moderate tricompartmental degenerative changes. 3. Prepatellar soft tissue swelling. Electronically Signed   By: Constance Holster M.D.   On: 08/30/2020 20:17   DG HIP OPERATIVE UNILAT WITH PELVIS LEFT  Result Date: 08/31/2020 CLINICAL DATA:  Portable operative imaging performed for left hip arthroplasty. EXAM: OPERATIVE LEFT HIP (WITH PELVIS IF PERFORMED) 8 VIEWS TECHNIQUE: Fluoroscopic spot image(s) were submitted for interpretation post-operatively. COMPARISON:  08/30/2020 FINDINGS: Images show the removal of the left femoral head and neck, and placement of a left hip arthroplasty, which appears well seated and aligned. IMPRESSION: Operative imaging provided for left hip replacement. Electronically Signed   By: Lajean Manes M.D.   On: 08/31/2020 11:46   DG  HIP UNILAT W OR W/O PELVIS 2-3 VIEWS LEFT  Result Date: 08/31/2020 CLINICAL DATA:  Postop left total hip arthroplasty. EXAM: DG HIP (WITH OR WITHOUT PELVIS) 2-3V LEFT COMPARISON:  Current operative images.  Prior exam dated 08/30/2020. FINDINGS: New left hip arthroplasty appears well seated and aligned. No acute fracture or evidence of an operative complication. IMPRESSION: Well-positioned left hip arthroplasty. No evidence of an operative complication. Electronically Signed   By: Lajean Manes M.D.   On: 08/31/2020 11:43   DG Hip Unilat W or Wo Pelvis 2-3 Views Left  Result Date: 08/30/2020 CLINICAL DATA:  Pain status post fall EXAM: DG HIP (WITH OR WITHOUT PELVIS) 2-3V RIGHT; DG HIP (WITH OR  WITHOUT PELVIS) 2-3V LEFT COMPARISON:  None. FINDINGS: There is no acute displaced fracture or dislocation involving the right hip. There is a probable cam type deformity. Mild degenerative changes are noted. There is an acute, displaced transcervical fracture of the proximal left femur. There is osteopenia. IMPRESSION: 1. Acute, displaced transcervical fracture of the proximal left femur. 2. No acute displaced fracture or dislocation of the right hip. 3. Osteopenia. Electronically Signed   By: Constance Holster M.D.   On: 08/30/2020 20:20   DG Hip Unilat W or Wo Pelvis 2-3 Views Right  Result Date: 08/30/2020 CLINICAL DATA:  Pain status post fall EXAM: DG HIP (WITH OR WITHOUT PELVIS) 2-3V RIGHT; DG HIP (WITH OR WITHOUT PELVIS) 2-3V LEFT COMPARISON:  None. FINDINGS: There is no acute displaced fracture or dislocation involving the right hip. There is a probable cam type deformity. Mild degenerative changes are noted. There is an acute, displaced transcervical fracture of the proximal left femur. There is osteopenia. IMPRESSION: 1. Acute, displaced transcervical fracture of the proximal left femur. 2. No acute displaced fracture or dislocation of the right hip. 3. Osteopenia. Electronically Signed   By: Constance Holster M.D.   On: 08/30/2020 20:20     Scheduled Meds: . Chlorhexidine Gluconate Cloth  6 each Topical Daily  . docusate sodium  100 mg Oral BID  . enoxaparin (LOVENOX) injection  40 mg Subcutaneous Q24H  . feeding supplement  237 mL Oral BID BM  . ferrous [id number] C-folic acid  1 capsule Oral BID PC  . nutrition supplement (JUVEN)  1 packet Oral BID BM  . pantoprazole  40 mg Oral BID   Continuous Infusions: . methocarbamol (ROBAXIN) IV    . tranexamic acid       LOS: 2 days     Enzo Bi, MD [facility] If 7PM-7AM, please contact night-coverage 09/01/2020, 11:45 AM

## 2020-09-02 ENCOUNTER — Encounter: Payer: Self-pay | Admitting: Orthopedic Surgery

## 2020-09-02 LAB — PREPARE RBC (CROSSMATCH)

## 2020-09-02 LAB — BASIC METABOLIC PANEL
Anion gap: 9 (ref 5–15)
BUN: 20 mg/dL (ref 8–23)
CO2: 20 mmol/L — ABNORMAL LOW (ref 22–32)
Calcium: 8.1 mg/dL — ABNORMAL LOW (ref 8.9–10.3)
Chloride: 100 mmol/L (ref 98–111)
Creatinine, Ser: 1.56 mg/dL — ABNORMAL HIGH (ref 0.61–1.24)
GFR, Estimated: 47 mL/min — ABNORMAL LOW (ref >60)
Glucose, Bld: 94 mg/dL (ref 70–99)
Potassium: 3.6 mmol/L (ref 3.5–5.1)
Sodium: 129 mmol/L — ABNORMAL LOW (ref 135–145)

## 2020-09-02 LAB — CBC
HCT: 20.1 % — ABNORMAL LOW (ref 39.0–52.0)
Hemoglobin: 6.9 g/dL — ABNORMAL LOW (ref 13.0–17.0)
MCH: 34.7 pg — ABNORMAL HIGH (ref 26.0–34.0)
MCHC: 34.3 g/dL (ref 30.0–36.0)
MCV: 101 fL — ABNORMAL HIGH (ref 80.0–100.0)
Platelets: 121 10*3/uL — ABNORMAL LOW (ref 150–400)
RBC: 1.99 MIL/uL — ABNORMAL LOW (ref 4.22–5.81)
RDW: 14.1 % (ref 11.5–15.5)
WBC: 3.4 10*3/uL — ABNORMAL LOW (ref 4.0–10.5)
nRBC: 0 % (ref 0.0–0.2)

## 2020-09-02 LAB — ABO/RH: ABO/RH(D): A NEG

## 2020-09-02 LAB — MAGNESIUM: Magnesium: 2.2 mg/dL (ref 1.7–2.4)

## 2020-09-02 MED ORDER — ACETAMINOPHEN 325 MG PO TABS
650.0000 mg | ORAL_TABLET | Freq: Four times a day (QID) | ORAL | Status: DC | PRN
  Administered 2020-09-02: 650 mg via ORAL
  Filled 2020-09-02: qty 2

## 2020-09-02 MED ORDER — CALCIUM CARBONATE ANTACID 500 MG PO CHEW
200.0000 mg | CHEWABLE_TABLET | Freq: Every day | ORAL | Status: DC
  Administered 2020-09-02 – 2020-09-04 (×3): 200 mg via ORAL
  Filled 2020-09-02 (×3): qty 1

## 2020-09-02 MED ORDER — POLYETHYLENE GLYCOL 3350 17 G PO PACK
34.0000 g | PACK | Freq: Two times a day (BID) | ORAL | Status: DC
  Administered 2020-09-02 – 2020-09-03 (×2): 34 g via ORAL
  Filled 2020-09-02 (×2): qty 2

## 2020-09-02 MED ORDER — SODIUM CHLORIDE 0.9% IV SOLUTION: Freq: Once | INTRAVENOUS | Status: AC

## 2020-09-02 MED ORDER — CHOLECALCIFEROL 10 MCG (400 UNIT) PO TABS
800.0000 [IU] | ORAL_TABLET | Freq: Every day | ORAL | Status: DC
  Administered 2020-09-02 – 2020-09-04 (×3): 800 [IU] via ORAL
  Filled 2020-09-02 (×3): qty 2

## 2020-09-02 MED ORDER — SODIUM CHLORIDE 0.9% IV SOLUTION: Freq: Once | INTRAVENOUS | Status: DC

## 2020-09-02 NOTE — Progress Notes (Signed)
   Subjective: 2 Days Post-Op Procedure(s) (LRB): TOTAL HIP ARTHROPLASTY ANTERIOR APPROACH (Left) Patient reports pain as mild.   Patient is well, and has had no acute complaints or problems.  Denies any CP, SOB, ABD pain. We will continue with physical therapy today.  Objective: Vital signs in last 24 hours: Temp:  [98 F (36.7 C)-98.4 F (36.9 C)] 98 F (36.7 C) (10/31 2304) Pulse Rate:  [77-89] 77 (10/31 2304) Resp:  [16] 16 (10/31 2304) BP: (117-118)/(59-65) 118/59 (10/31 2304) SpO2:  [100 %] 100 % (10/31 2304)  Intake/Output from previous day: 10/31 0701 - 11/01 0700 In: 100 [IV Piggyback:100] Out: 550 [Urine:550] Intake/Output this shift: No intake/output data recorded.  Recent Labs    08/30/20 1756 08/31/20 0708 09/01/20 0258 09/02/20 0340  HGB 10.6* 9.5* 7.6* 6.9*   Recent Labs    09/01/20 0258 09/02/20 0340  WBC 4.4 3.4*  RBC 2.19* 1.99*  HCT 21.9* 20.1*  PLT 140* 121*   Recent Labs    09/01/20 0258 09/02/20 0340  NA 132* 129*  K 3.1* 3.6  CL 102 100  CO2 21* 20*  BUN 19 20  CREATININE 1.54* 1.56*  GLUCOSE 105* 94  CALCIUM 8.1* 8.1*   Recent Labs    08/30/20 1756  INR 1.0    EXAM General - Patient is Alert, Appropriate and Oriented Extremity - Neurovascular intact Sensation intact distally Intact pulses distally Incision: dressing C/D/I and no drainage No cellulitis present Compartment soft Mild swelling and tenderness to both ankles.  Patient is able to slightly ankle plantarflex and dorsiflex bilaterally Dressing - dressing C/D/I and no drainage Praveena intact without drainage Motor Function - intact, moving foot and toes well on exam.   Past Medical History:  Diagnosis Date  . Chronic tophaceous gout   . Peripheral neuropathy     Assessment/Plan:   2 Days Post-Op Procedure(s) (LRB): TOTAL HIP ARTHROPLASTY ANTERIOR APPROACH (Left) Principal Problem:   Transcervical fracture of left femur, closed, initial encounter  ([place]) Active Problems:   Chronic gouty arthropathy with tophus (tophi)   Idiopathic peripheral neuropathy   Accidental fall   Preoperative clearance   Osteopenia determined by x-ray   Closed left hip fracture, initial encounter ([place])   Acute kidney injury superimposed on CKD (HCC)   Closed fracture of lumbar vertebral body (HCC)   Blood pressure elevated without history of HTN  Estimated body mass index is 24.41 kg/m as calculated from the following:   Height as of this encounter: 6' (1.829 m).   Weight as of this encounter: 81.6 kg. Advance diet Up with therapy  Work on bowel movement Patient refusing SCDs.  Can try foot pumps. Acute postop blood loss anemia -hemoglobin 6.9.  Transfuse 1 unit of packed red blood cells.  Recheck hemoglobin in the morning. Care management to assist with discharge.    DVT Prophylaxis - Lovenox, TED hose and SCDs Weight-Bearing as tolerated to left leg   T. Rachelle Hora, PA-C [place] 09/02/2020, 8:06 AM

## 2020-09-02 NOTE — Progress Notes (Signed)
PROGRESS NOTE    Mark Jimenez  VXB:[id number] DOB: 14-May-1951 DOA: 08/30/2020 [facility]    Assessment & Plan:   Principal Problem:   Transcervical fracture of left femur, closed, initial encounter ([place]) Active Problems:   Chronic gouty arthropathy with tophus (tophi)   Idiopathic peripheral neuropathy   Accidental fall   Preoperative clearance   Osteopenia determined by x-ray   Closed left hip fracture, initial encounter ([place])   Acute kidney injury superimposed on CKD ([place])   Closed fracture of lumbar vertebral body (HCC)   Blood pressure elevated without history of HTN    Mark Jimenez is a 69 y.o. male with medical history significant for tophaceous gout, peripheral neuropathy, hypothyroidism who presents to the emergency room following a fall in which his foot caught on something as he turned around while standing causing him to fall onto his left side.  Since the fall he has been having intractable pain in his left hip and low back.  He denied hitting his head and had no loss of consciousness and denies other injury.  He was previously in his usual state of health.     Transcervical fracture of left femur, closed, initial encounter  S/p left TOTAL HIP ARTHROPLASTY on 08/31/20 -Patient sustained accidental fall after he tripped while turning around in room.  Had no preceding symptoms -X-ray showing transcervical fracture of the left femur as well as osteopenia PLAN: -Pain management, per ortho --PT/OT rec SNF --weight-bearing as tolerated   Suspect Acute closed fracture of L1 vertebral body (HCC) Multiple compression deformities of the thoracolumbar spine. -CT lumbar spine shows above -Management per Ortho, Dr. Rudene Christians --pain management, per ortho  Post-op anemia, likely due to blood loss --Hgb dropped to 6.9 this morning --1u pRBC today --Monitor and transfuse to keep Hgb >7  Osteopenia determined by x-ray --vit D and Ca supplements to be determined  by ortho  Suspect acute kidney injury superimposed on CKD (HCC) -Creatinine 1.96 but no recent creatinine on file.  Cr trended down to 1.64 next day with IVF. --Hold MIVF and encourage oral hydration  Elevated BP -Patient with no prior history of hypertension with blood pressure 200/103 in the ER, likely due to pain.  Post-op, BP down to 100's-120's. --IV labetalol PRN    Chronic gouty arthropathy with tophus (tophi) -Prior documentation of intolerance to allopurinol -Colchicine and prednisone as needed --refer to Rheum as outpatient    Idiopathic peripheral neuropathy -Appears stable     DVT prophylaxis: Lovenox SQ Code Status: Full code  Family Communication:  Status is: inpatient Dispo:   The patient is from: home Anticipated d/c is to: SNF rehab Anticipated d/c date is: 1-2 days Patient currently is not medically stable to d/c due to: Hgb need to stabilize    Subjective and Interval History:  Pain controlled, working with PT.  Hgb dropped <7, received 1u pRBC.   Objective: Vitals:   09/02/20 1127 09/02/20 1211 09/02/20 1336 09/02/20 1539  BP: (!) 99/55 (!) 103/56 113/61 101/63  Pulse: 80 78 78 85  Resp: 17 16 18 17   Temp: 98 F (36.7 C) 98.3 F (36.8 C) 98 F (36.7 C) 97.9 F (36.6 C)  TempSrc: Oral Oral Oral Oral  SpO2: 100% 100% 99% 100%  Weight:      Height:        Intake/Output Summary (Last 24 hours) at 09/02/2020 1606 Last data filed at 09/02/2020 1336 Gross per 24 hour  Intake 689.97 ml  Output 350  ml  Net 339.97 ml   Filed Weights   08/30/20 1741 09/01/20 0645  Weight: 77.1 kg 81.6 kg    Examination:   Constitutional: NAD, AAOx3 HEENT: conjunctivae and lids normal, EOMI CV: No cyanosis.   RESP: normal respiratory effort, on RA SKIN: warm, dry.  Numerous gout nodules over finger joints and elbow Neuro: II - XII grossly intact.   Psych: Normal mood and affect.  Appropriate judgement and reason   Data Reviewed: I have personally  reviewed following labs and imaging studies  CBC: Recent Labs  Lab 08/30/20 1756 08/31/20 0708 09/01/20 0258 09/02/20 0340  WBC 6.7 4.4 4.4 3.4*  NEUTROABS 5.7  --   --   --   HGB 10.6* 9.5* 7.6* 6.9*  HCT 29.9* 27.2* 21.9* 20.1*  MCV 100.3* 101.5* 100.0 101.0*  PLT 165 142* 140* 767*   Basic Metabolic Panel: Recent Labs  Lab 08/30/20 1756 08/31/20 0708 09/01/20 0258 09/02/20 0340  NA 135 136 132* 129*  K 3.7 3.5 3.1* 3.6  CL 101 104 102 100  CO2 18* 20* 21* 20*  GLUCOSE 115* 82 105* 94  BUN 26* 23 19 20   CREATININE 1.96* 1.64* 1.54* 1.56*  CALCIUM 8.9 8.8* 8.1* 8.1*  MG  --   --  1.4* 2.2   GFR: Estimated Creatinine Clearance: 47.7 mL/min (A) (by C-G formula based on SCr of 1.56 mg/dL (H)). Liver Function Tests: Recent Labs  Lab 08/30/20 1756  AST 46*  ALT 38  ALKPHOS 101  BILITOT 3.0*  PROT 8.1  ALBUMIN 3.6   No results for input(s): LIPASE, AMYLASE in the last 168 hours. No results for input(s): AMMONIA in the last 168 hours. Coagulation Profile: Recent Labs  Lab 08/30/20 1756  INR 1.0   Cardiac Enzymes: No results for input(s): CKTOTAL, CKMB, CKMBINDEX, TROPONINI in the last 168 hours. BNP (last 3 results) No results for input(s): PROBNP in the last 8760 hours. HbA1C: No results for input(s): HGBA1C in the last 72 hours. CBG: No results for input(s): GLUCAP in the last 168 hours. Lipid Profile: No results for input(s): CHOL, HDL, LDLCALC, TRIG, CHOLHDL, LDLDIRECT in the last 72 hours. Thyroid Function Tests: No results for input(s): TSH, T4TOTAL, FREET4, T3FREE, THYROIDAB in the last 72 hours. Anemia Panel: No results for input(s): VITAMINB12, FOLATE, FERRITIN, TIBC, IRON, RETICCTPCT in the last 72 hours. Sepsis Labs: No results for input(s): PROCALCITON, LATICACIDVEN in the last 168 hours.  Recent Results (from the past 240 hour(s))  Respiratory Panel by RT PCR (Flu A&B, Covid) - Nasopharyngeal Swab     Status: None   Collection Time:  08/30/20 10:30 PM   Specimen: Nasopharyngeal Swab  Result Value Ref Range Status   SARS Coronavirus 2 by RT PCR NEGATIVE NEGATIVE Final    Comment: (NOTE) SARS-CoV-2 target nucleic acids are NOT DETECTED.  The SARS-CoV-2 RNA is generally detectable in upper respiratoy specimens during the acute phase of infection. The lowest concentration of SARS-CoV-2 viral copies this assay can detect is 131 copies/mL. A negative result does not preclude SARS-Cov-2 infection and should not be used as the sole basis for treatment or other patient management decisions. A negative result may occur with  improper specimen collection/handling, submission of specimen other than nasopharyngeal swab, presence of viral mutation(s) within the areas targeted by this assay, and inadequate number of viral copies (<131 copies/mL). A negative result must be combined with clinical observations, patient history, and epidemiological information. The expected result is Negative.  Fact Sheet  for Patients:  PinkCheek.be  Fact Sheet for Healthcare Providers:  GravelBags.it  This test is no t yet approved or cleared by the [place] FDA and  has been authorized for detection and/or diagnosis of SARS-CoV-2 by FDA under an Emergency Use Authorization (EUA). This EUA will remain  in effect (meaning this test can be used) for the duration of the COVID-19 declaration under Section 564(b)(1) of the Act, 21 U.S.C. section 360bbb-3(b)(1), unless the authorization is terminated or revoked sooner.     Influenza A by PCR NEGATIVE NEGATIVE Final   Influenza B by PCR NEGATIVE NEGATIVE Final    Comment: (NOTE) The Xpert Xpress SARS-CoV-2/FLU/RSV assay is intended as an aid in  the diagnosis of influenza from Nasopharyngeal swab specimens and  should not be used as a sole basis for treatment. Nasal washings and  aspirates are unacceptable for Xpert Xpress  SARS-CoV-2/FLU/RSV  testing.  Fact Sheet for Patients: PinkCheek.be  Fact Sheet for Healthcare Providers: GravelBags.it  This test is not yet approved or cleared by the [place] FDA and  has been authorized for detection and/or diagnosis of SARS-CoV-2 by  FDA under an Emergency Use Authorization (EUA). This EUA will remain  in effect (meaning this test can be used) for the duration of the  Covid-19 declaration under Section 564(b)(1) of the Act, 21  U.S.C. section 360bbb-3(b)(1), unless the authorization is  terminated or revoked. Performed at [facility], [address], [place], [place] [address]       Radiology Studies: No results found.   Scheduled Meds: . sodium chloride   Intravenous Once  . calcium carbonate  200 mg of elemental calcium Oral Daily   And  . cholecalciferol  800 Units Oral Daily  . Chlorhexidine Gluconate Cloth  6 each Topical Daily  . docusate sodium  100 mg Oral BID  . enoxaparin (LOVENOX) injection  40 mg Subcutaneous Q24H  . feeding supplement  237 mL Oral BID BM  . ferrous [id number] C-folic acid  1 capsule Oral BID PC  . nutrition supplement (JUVEN)  1 packet Oral BID BM  . pantoprazole  40 mg Oral BID  . polyethylene glycol  34 g Oral BID   Continuous Infusions: . methocarbamol (ROBAXIN) IV    . tranexamic acid       LOS: 3 days     Enzo Bi, MD [facility] If 7PM-7AM, please contact night-coverage 09/02/2020, 4:06 PM

## 2020-09-02 NOTE — Progress Notes (Signed)
Attempted to remove patients foley. Patient stated the doctor said he could keep it in. Kept it in per patient and will pass on to day shift.

## 2020-09-02 NOTE — NC FL2 (Signed)
[place] LEVEL OF CARE SCREENING TOOL     IDENTIFICATION  Patient Name: Mark Jimenez Birthdate: 1950-12-17 Sex: male Admission Date (Current Location): 08/30/2020  [place] and [place] Number:  Engineering geologist and Address:  [facility], [address], [place], [place] [address]      Provider Number: [id number]  Attending Physician Name and Address:  Enzo Bi, MD  Relative Name and Phone Number:  Hence Derrick    Current Level of Care: Hospital Recommended Level of Care: [place] Prior Approval Number:    Date Approved/Denied:   PASRR Number: [id number]  Discharge Plan: SNF    Current Diagnoses: Patient Active Problem List   Diagnosis Date Noted  . Transcervical fracture of left femur, closed, initial encounter ([place]) 08/30/2020  . Accidental fall 08/30/2020  . Preoperative clearance 08/30/2020  . Osteopenia determined by x-ray 08/30/2020  . Closed left hip fracture, initial encounter ([place]) 08/30/2020  . Acute kidney injury superimposed on CKD ([place]) 08/30/2020  . Closed fracture of lumbar vertebral body ([place]) 08/30/2020  . Blood pressure elevated without history of HTN 08/30/2020  . Acquired hypothyroidism 02/20/2016  . Idiopathic peripheral neuropathy 02/20/2016  . Anxiety 02/20/2016  . Chronic gouty arthropathy with tophus (tophi) 05/24/2012    Orientation RESPIRATION BLADDER Height & Weight     Self, Time, Situation, Place  Normal External catheter Weight: 81.6 kg Height:  6' (182.9 cm)  BEHAVIORAL SYMPTOMS/MOOD NEUROLOGICAL BOWEL NUTRITION STATUS      Continent Diet (Regular)  AMBULATORY STATUS COMMUNICATION OF NEEDS Skin   Extensive Assist Verbally Surgical wounds                       Personal Care Assistance Level of Assistance  Dressing, Feeding, Bathing Bathing Assistance: Limited assistance Feeding assistance: Limited assistance Dressing Assistance: Limited assistance      Functional Limitations Info  Speech, Hearing, Sight Sight Info: Adequate Hearing Info: Adequate Speech Info: Adequate    SPECIAL CARE FACTORS FREQUENCY  PT (By licensed PT), OT (By licensed OT)                    Contractures Contractures Info: Not present    Additional Factors Info  Code Status, Allergies Code Status Info: Full Allergies Info: No known allergies           Current Medications (09/02/2020):  This is the current hospital active medication list Current Facility-Administered Medications  Medication Dose Route Frequency Provider Last Rate Last Admin  . 0.9 %  sodium chloride infusion (Manually program via Guardrails IV Fluids)   Intravenous Once Lang Snow, NP      . acetaminophen (TYLENOL) tablet 650 mg  650 mg Oral Q6H PRN Enzo Bi, MD      . alum & mag hydroxide-simeth (MAALOX/MYLANTA) 200-200-20 MG/5ML suspension 30 mL  30 mL Oral Q4H PRN Hessie Knows, MD   30 mL at 08/31/20 1707  . bisacodyl (DULCOLAX) suppository 10 mg  10 mg Rectal Daily PRN Hessie Knows, MD      . calcium carbonate (TUMS - dosed in mg elemental calcium) chewable tablet 200 mg of elemental calcium  200 mg of elemental calcium Oral Daily Hessie Knows, MD       And  . cholecalciferol (VITAMIN D3) tablet 800 Units  800 Units Oral Daily Hessie Knows, MD      . Chlorhexidine Gluconate Cloth 2 % PADS 6 each  6 each Topical Daily  Enzo Bi, MD   6 each at 09/01/20 [phone]  . diphenhydrAMINE (BENADRYL) 12.5 MG/5ML elixir 12.5-25 mg  12.5-25 mg Oral Q4H PRN Hessie Knows, MD      . docusate sodium (COLACE) capsule 100 mg  100 mg Oral BID Hessie Knows, MD   100 mg at 09/02/20 0834  . enoxaparin (LOVENOX) injection 40 mg  40 mg Subcutaneous Q24H Hessie Knows, MD   40 mg at 09/02/20 0836  . feeding supplement (ENSURE ENLIVE / ENSURE PLUS) liquid 237 mL  237 mL Oral BID BM Enzo Bi, MD   237 mL at 08/31/20 1338  . ferrous [id number] C-folic acid (TRINSICON / FOLTRIN)  capsule 1 capsule  1 capsule Oral BID PC Hessie Knows, MD   1 capsule at 09/02/20 0834  . HYDROcodone-acetaminophen (NORCO/VICODIN) 5-325 MG per tablet 1-2 tablet  1-2 tablet Oral Q6H PRN Hessie Knows, MD   1 tablet at 09/02/20 0926  . labetalol (NORMODYNE) injection 10 mg  10 mg Intravenous Q6H PRN Hessie Knows, MD      . magnesium citrate solution 1 Bottle  1 Bottle Oral Once PRN Hessie Knows, MD      . magnesium hydroxide (MILK OF MAGNESIA) suspension 30 mL  30 mL Oral Daily PRN Hessie Knows, MD      . menthol-cetylpyridinium (CEPACOL) lozenge 3 mg  1 lozenge Oral PRN Hessie Knows, MD       Or  . phenol (CHLORASEPTIC) mouth spray 1 spray  1 spray Mouth/Throat PRN Hessie Knows, MD      . methocarbamol (ROBAXIN) tablet 500 mg  500 mg Oral Q6H PRN Hessie Knows, MD   500 mg at 09/01/20 1447   Or  . methocarbamol (ROBAXIN) 500 mg in dextrose 5 % 50 mL IVPB  500 mg Intravenous Q6H PRN Hessie Knows, MD      . metoCLOPramide (REGLAN) tablet 5-10 mg  5-10 mg Oral Q8H PRN Hessie Knows, MD   10 mg at 09/02/20 8841   Or  . metoCLOPramide (REGLAN) injection 5-10 mg  5-10 mg Intravenous Q8H PRN Hessie Knows, MD      . nutrition supplement (JUVEN) (JUVEN) powder packet 1 packet  1 packet Oral BID BM Enzo Bi, MD      . ondansetron [facility]) tablet 4 mg  4 mg Oral Q6H PRN Hessie Knows, MD       Or  . ondansetron [facility]) injection 4 mg  4 mg Intravenous Q6H PRN Hessie Knows, MD      . pantoprazole (PROTONIX) EC tablet 40 mg  40 mg Oral BID Enzo Bi, MD   40 mg at 09/02/20 0834  . polyethylene glycol (MIRALAX / GLYCOLAX) packet 34 g  34 g Oral BID Enzo Bi, MD      . tranexamic acid (CYKLOKAPRON) IVPB 1,000 mg  1,000 mg Intravenous Once Hessie Knows, MD         Discharge Medications: Please see discharge summary for a list of discharge medications.  Relevant Imaging Results:  Relevant Lab Results:   Additional Information SS# [id number]  Shelbie Ammons, RN

## 2020-09-02 NOTE — Progress Notes (Signed)
Foley catheter removed. 300cc of dark amber urine.

## 2020-09-02 NOTE — TOC Progression Note (Signed)
Transition of Care [facility]) - Progression Note    Patient Details  Name: Roosvelt Churchwell MRN: [id number] Date of Birth: 12-Jun-1951  [facility] CM/SW [place], RN Phone Number: 09/02/2020, 4:03 PM  Clinical Narrative:   RNCM reached out to patient regarding him now being ready for SNF. Patient reports that he understands he will need rehab before he goes home and is agreeable to a bed search. Patient is agreeable to a search of all surrounding facilities. RNCM completed PASSR, FL-2 and started bed search.     Expected Discharge Plan: [place] Barriers to Discharge: Continued Medical Work up  Expected Discharge Plan and Services Expected Discharge Plan: [place] arrangements for the past 2 months: Single Family Home                                       Social Determinants of Health (SDOH) Interventions    Readmission Risk Interventions No flowsheet data found.

## 2020-09-02 NOTE — Progress Notes (Signed)
Physical Therapy Treatment Patient Details Name: Mark Jimenez MRN: [id number] DOB: Aug 04, 1951 Today's Date: 09/02/2020    History of Present Illness Pt is a 69 y/o M admitted on 08/30/20 s/p fall onto his L side now with intractable pain in L hip & low back. X ray revealed transcervical fx of L hip, xray then CT of lumbar spine suspect for acute inferior endplate fx at L1 that involves anterior vertebral body. With regard to lumbar fx, if it limits his ability to ambulate pt/ortho to consider kyphoplasty. Pt now s/p L THA (anterior approach) on 08/31/20. PMH: gout, peripheral neuropathy, hypothyroidism    PT Comments    MD & nurse cleared pt for participation as he received blood transfusion this AM. Pt agreeable to tx. Pt performs LLE strengthening exercises with cuing for technique & AAROM: ankle pumps (minimal LLE ROM), heel slides, short arc quads, and glute sets. Pt also performs LLE LAQ seated EOB but unable to actively move through full ROM. Pt is able to transfer to sitting EOB with assist and attempts sit<>stand x 2 trials with cuing for hand placement on RW. Pt moves slowly through transitional movements and is unable to fully extend to upright standing position, remaining forward flexed on RW. Pt reports back stiffness due to hospital bed. Pt attempts side steps at EOB but unable to advance LLE. Will continue to follow pt acutely to focus on LLE strengthening & increasing independence with mobility. Continue to recommend SNF f/u at d/c.     Follow Up Recommendations  SNF;Supervision/Assistance - 24 hour     Equipment Recommendations  None recommended by PT (TBD in next venue)    Recommendations for Other Services       Precautions / Restrictions Precautions Precautions: Fall Precaution Comments: (anterior THA, no precautions) Restrictions Weight Bearing Restrictions: Yes LLE Weight Bearing: Weight bearing as tolerated    Mobility  Bed Mobility Overal bed mobility: Needs  Assistance Bed Mobility: Supine to Sit;Sit to Supine     Supine to sit: Mod assist;HOB elevated Sit to supine: Mod assist;HOB elevated   General bed mobility comments: significantly extra time for supine>sit with HOB elevated then flat, pt with limited ability to assist with LUE (primarily uses RUE), assist with elevating BLE onto bed for sit>supine  Transfers Overall transfer level: Needs assistance Equipment used: Rolling walker (2 wheeled) Transfers: Sit to/from Stand Sit to Stand: Mod assist;From elevated surface         General transfer comment: cuing for safe hand placement with use of RW  Ambulation/Gait             General Gait Details: attempted side steps at EOB with RW but pt with difficulty lifting/moving LLE   Stairs             Wheelchair Mobility    Modified Rankin (Stroke Patients Only)       Balance Overall balance assessment: Needs assistance;Mild deficits observed, not formally tested Sitting-balance support: Bilateral upper extremity supported;Feet supported Sitting balance-Leahy Scale: Fair       Standing balance-Leahy Scale: Poor Standing balance comment: BUE support on RW                            Cognition Arousal/Alertness: Awake/alert Behavior During Therapy: WFL for tasks assessed/performed Overall Cognitive Status: Within Functional Limits for tasks assessed  Exercises      General Comments General comments (skin integrity, edema, etc.): no c/o adverse symptoms during session      Pertinent Vitals/Pain Pain Assessment: Faces Faces Pain Scale: Hurts a little bit Pain Location: L groin/thigh, chronic back pain Pain Descriptors / Indicators: Discomfort Pain Intervention(s): Limited activity within patient's tolerance;Repositioned    Home Living                      Prior Function            PT Goals (current goals can now be found in  the care plan section) Acute Rehab PT Goals Patient Stated Goal: to get better PT Goal Formulation: With patient Time For Goal Achievement: 09/15/20 Potential to Achieve Goals: Fair Progress towards PT goals: Progressing toward goals    Frequency    BID      PT Plan Current plan remains appropriate    Co-evaluation              AM-PAC PT "6 Clicks" Mobility   Outcome Measure  Help needed turning from your back to your side while in a flat bed without using bedrails?: A Lot Help needed moving from lying on your back to sitting on the side of a flat bed without using bedrails?: A Lot Help needed moving to and from a bed to a chair (including a wheelchair)?: Total Help needed standing up from a chair using your arms (e.g., wheelchair or bedside chair)?: A Lot Help needed to walk in hospital room?: Total Help needed climbing 3-5 steps with a railing? : Total 6 Click Score: 9    End of Session Equipment Utilized During Treatment: Gait belt Activity Tolerance: Patient tolerated treatment well Patient left: in bed;with nursing/sitter in room Nurse Communication:  (bleeding from B heels) PT Visit Diagnosis: Muscle weakness (generalized) (M62.81);Difficulty in walking, not elsewhere classified (R26.2);Other abnormalities of gait and mobility (R26.89)     Time: 1410-3013 PT Time Calculation (min) (ACUTE ONLY): 40 min  Charges:  $Therapeutic Exercise: 8-22 mins $Therapeutic Activity: 23-37 mins                     Lavone Nian, PT, DPT 09/02/20, 2:57 PM    Waunita Schooner 09/02/2020, 2:51 PM

## 2020-09-02 NOTE — Progress Notes (Signed)
PT Cancellation Note  Patient Details Name: Mark Jimenez MRN: [id number] DOB: [birth year]   Cancelled Treatment:     Upon chart review pt with critically low Hgb & Hct, still awaiting blood transfusion. Will hold PT for now and plan to follow up in PM to see if pt is appropriate for tx.   Lavone Nian, PT, DPT 09/02/20, 8:42 AM    Waunita Schooner 09/02/2020, 8:41 AM

## 2020-09-02 NOTE — Progress Notes (Signed)
Informed consent for blood transfusion obtained and placed in patient's chart.

## 2020-09-03 ENCOUNTER — Encounter: Payer: Self-pay | Admitting: Orthopedic Surgery

## 2020-09-03 LAB — CBC
HCT: 22.4 % — ABNORMAL LOW (ref 39.0–52.0)
Hemoglobin: 7.8 g/dL — ABNORMAL LOW (ref 13.0–17.0)
MCH: 33.8 pg (ref 26.0–34.0)
MCHC: 34.8 g/dL (ref 30.0–36.0)
MCV: 97 fL (ref 80.0–100.0)
Platelets: 129 10*3/uL — ABNORMAL LOW (ref 150–400)
RBC: 2.31 MIL/uL — ABNORMAL LOW (ref 4.22–5.81)
RDW: 15.7 % — ABNORMAL HIGH (ref 11.5–15.5)
WBC: 2.7 10*3/uL — ABNORMAL LOW (ref 4.0–10.5)
nRBC: 0 % (ref 0.0–0.2)

## 2020-09-03 LAB — BPAM RBC
Blood Product Expiration Date: 202111232359
ISSUE DATE / TIME: 202111011102
Unit Type and Rh: 600

## 2020-09-03 LAB — TYPE AND SCREEN
ABO/RH(D): A NEG
Antibody Screen: NEGATIVE
Unit division: 0

## 2020-09-03 LAB — BASIC METABOLIC PANEL
Anion gap: 6 (ref 5–15)
BUN: 25 mg/dL — ABNORMAL HIGH (ref 8–23)
CO2: 23 mmol/L (ref 22–32)
Calcium: 8.3 mg/dL — ABNORMAL LOW (ref 8.9–10.3)
Chloride: 100 mmol/L (ref 98–111)
Creatinine, Ser: 1.64 mg/dL — ABNORMAL HIGH (ref 0.61–1.24)
GFR, Estimated: 44 mL/min — ABNORMAL LOW (ref >60)
Glucose, Bld: 95 mg/dL (ref 70–99)
Potassium: 3.6 mmol/L (ref 3.5–5.1)
Sodium: 129 mmol/L — ABNORMAL LOW (ref 135–145)

## 2020-09-03 LAB — MAGNESIUM: Magnesium: 2 mg/dL (ref 1.7–2.4)

## 2020-09-03 MED ORDER — ENOXAPARIN SODIUM 40 MG/0.4ML ~~LOC~~ SOLN: 40.0000 mg | SUBCUTANEOUS | 0 refills | Status: DC

## 2020-09-03 MED ORDER — HYDROCODONE-ACETAMINOPHEN 5-325 MG PO TABS
1.0000 | ORAL_TABLET | Freq: Four times a day (QID) | ORAL | 0 refills | Status: DC | PRN
Start: 2020-09-03 — End: 2021-02-04

## 2020-09-03 NOTE — Progress Notes (Signed)
Physical Therapy Treatment Patient Details Name: Mark Jimenez MRN: [id number] DOB: [birth year] Today's Date: 09/03/2020    History of Present Illness Pt is a 69 y/o M admitted on 08/30/20 s/p fall onto his L side now with intractable pain in L hip & low back. X ray revealed transcervical fx of L hip, xray then CT of lumbar spine suspect for acute inferior endplate fx at L1 that involves anterior vertebral body. With regard to lumbar fx, if it limits his ability to ambulate pt/ortho to consider kyphoplasty. Pt now s/p L THA (anterior approach) on 08/31/20. PMH: gout, peripheral neuropathy, hypothyroidism    PT Comments    Patient alert, agreeable to PT, denied pain at rest. Did endorse pain with mobility and sit <> stand attempts in LLE and back. Several supine exercises performed AAROM for LLE due to pain/weakness. Supine to sit with maxA and extended time to maximize pt participation. Sit <> stand attempted 3 times, unable to move his LEs posteriorly underneath him to improve weight bearing in sitting. second attempt pt adamant to attempt independently, but unable to stand. third attempt pt able to come up into standing better with legs positioned better underneath him, but fatigued quickly, reported he was unable to use LUE well due to elbow complaints. Returned to supine with 2+ for safety maxA, and performed rolling maxA for repositioning bed linens. The patient would benefit from further skilled PT intervention to progress towards goals. Recommendation remains appropriate.       Follow Up Recommendations  SNF;Supervision/Assistance - 24 hour     Equipment Recommendations  Other (comment) (TBD at next venue)    Recommendations for Other Services       Precautions / Restrictions Precautions Precautions: Fall Precaution Comments: (anterior THA, no precautions) Restrictions Weight Bearing Restrictions: Yes LLE Weight Bearing: Weight bearing as tolerated    Mobility  Bed  Mobility Overal bed mobility: Needs Assistance Bed Mobility: Supine to Sit;Sit to Supine Rolling: Max assist   Supine to sit: Max assist;HOB elevated Sit to supine: Max assist;+2 for safety/equipment   General bed mobility comments: maxA for bed mobility today pt reported feeling stiffer than last attempt. returned to supine with 2+ for safety  Transfers Overall transfer level: Needs assistance Equipment used: Rolling walker (2 wheeled) Transfers: Sit to/from Stand Sit to Stand: Max assist;+2 physical assistance         General transfer comment: attempted 3 times, unable to move his LEs posteriorly underneath him to improve weight bearing in sitting. second attempt pt adamant to attempt independently unable to stand. third attempt pt able to come up into standing better, but fatigued quickly, reported he was unable to use LUE well due to elbow complaints.  Ambulation/Gait             General Gait Details: pt unable   Stairs             Wheelchair Mobility    Modified Rankin (Stroke Patients Only)       Balance Overall balance assessment: Needs assistance;Mild deficits observed, not formally tested Sitting-balance support: Bilateral upper extremity supported;Feet supported Sitting balance-Leahy Scale: Fair       Standing balance-Leahy Scale: Zero Standing balance comment: BUE support on RW                            Cognition Arousal/Alertness: Awake/alert Behavior During Therapy: WFL for tasks assessed/performed Overall Cognitive Status: Within Functional Limits for tasks assessed  General Comments: talkative throughout session but easily redirected      Exercises Total Joint Exercises Ankle Circles/Pumps: AROM;Both;10 reps Quad Sets: AROM;Both;10 reps Heel Slides: AAROM;Strengthening;Left;10 reps Hip ABduction/ADduction: AAROM;Strengthening;Left;10 reps    General Comments General  comments (skin integrity, edema, etc.): Pt dizzy, BP 93/67 HR and spO2 WFLs. Pt reported resolvement of dizziness once he returned to supine      Pertinent Vitals/Pain Pain Assessment: Faces Faces Pain Scale: Hurts a little bit Pain Location: L groin/thigh, chronic back pain Pain Descriptors / Indicators: Discomfort;Tender Pain Intervention(s): Limited activity within patient's tolerance;Repositioned;Monitored during session    Home Living                      Prior Function            PT Goals (current goals can now be found in the care plan section) Progress towards PT goals: Progressing toward goals    Frequency    BID      PT Plan      Co-evaluation              AM-PAC PT "6 Clicks" Mobility   Outcome Measure  Help needed turning from your back to your side while in a flat bed without using bedrails?: A Lot Help needed moving from lying on your back to sitting on the side of a flat bed without using bedrails?: A Lot Help needed moving to and from a bed to a chair (including a wheelchair)?: Total Help needed standing up from a chair using your arms (e.g., wheelchair or bedside chair)?: Total Help needed to walk in hospital room?: Total Help needed climbing 3-5 steps with a railing? : Total 6 Click Score: 8    End of Session Equipment Utilized During Treatment: Gait belt Activity Tolerance: Patient tolerated treatment well Patient left: in bed;with call bell/phone within reach;with bed alarm set Nurse Communication: Mobility status (CNA informed pt wanted a bed bath) PT Visit Diagnosis: Muscle weakness (generalized) (M62.81);Difficulty in walking, not elsewhere classified (R26.2);Other abnormalities of gait and mobility (R26.89)     Time: 0177-9390 PT Time Calculation (min) (ACUTE ONLY): 39 min  Charges:  $Therapeutic Exercise: 23-37 mins                    Lieutenant Diego PT, DPT 11:05 AM,09/03/20

## 2020-09-03 NOTE — TOC Progression Note (Addendum)
Transition of Care [facility]) - Progression Note    Patient Details  Name: Murrell Dome MRN: [id number] Date of Birth: [birth year]  Transition of Care [facility]) CM/SW Wilber, LCSW Phone Number: 09/03/2020, 1:57 PM  Clinical Narrative:   CSW spoke with patient about bed offer for [facility] in [place]. Patient agreeable to going to [facility] for SNF rehab at discharge. Started Risk manager. Informed Ricky with [facility] that patient wants to accept bed offer. Per Dr Billie Ruddy, patient medically ready once we have insurance authorization.    Expected Discharge Plan: [place] Barriers to Discharge: Continued Medical Work up  Expected Discharge Plan and Services Expected Discharge Plan: [place] arrangements for the past 2 months: Single Family Home                                       Social Determinants of Health (SDOH) Interventions    Readmission Risk Interventions No flowsheet data found.

## 2020-09-03 NOTE — Care Management Important Message (Signed)
Important Message  Patient Details  Name: Mark Jimenez MRN: [id number] Date of Birth: 11/23/1950   Medicare Important Message Given:  Yes     [name] A Lillionna Nabi 09/03/2020, 10:26 AM

## 2020-09-03 NOTE — Progress Notes (Signed)
Physical Therapy Treatment Patient Details Name: Mark Jimenez MRN: [id number] DOB: 04-Feb-1951 Today's Date: 09/03/2020    History of Present Illness Pt is a 69 y/o M admitted on 08/30/20 s/p fall onto his L side now with intractable pain in L hip & low back. X ray revealed transcervical fx of L hip, xray then CT of lumbar spine suspect for acute inferior endplate fx at L1 that involves anterior vertebral body. With regard to lumbar fx, if it limits his ability to ambulate pt/ortho to consider kyphoplasty. Pt now s/p L THA (anterior approach) on 08/31/20. PMH: gout, peripheral neuropathy, hypothyroidism    PT Comments    Patient alert, agreeable to PM session of PT. Focused on exercises per pt request due to fatigue and wanting to attempt OOB mobility tomorrow. Pt able to perform supine exercises, AAROM for LLE due to weakness/pain. Repositioned with all needs in reach, pt in NAD. The patient would benefit from further skilled PT intervention to continue to progress towards goals. Recommendation remains appropriate.    Follow Up Recommendations  SNF;Supervision/Assistance - 24 hour     Equipment Recommendations  Other (comment) (TBD)    Recommendations for Other Services       Precautions / Restrictions Precautions Precautions: Fall Precaution Comments: (anterior THA, no precautions) Restrictions Weight Bearing Restrictions: Yes LLE Weight Bearing: Weight bearing as tolerated    Mobility  Bed Mobility General bed mobility comments: deferred, PM session focused on exercises per pt request due to fatigue and wanting to attempt OOB mobility tomorrow.  Transfers        Ambulation/Gait             General Gait Details: pt unable   Stairs             Wheelchair Mobility    Modified Rankin (Stroke Patients Only)       Balance Overall balance assessment: Needs assistance;Mild deficits observed, not formally tested Sitting-balance support: Bilateral upper  extremity supported;Feet supported Sitting balance-Leahy Scale: Fair       Standing balance-Leahy Scale: Zero Standing balance comment: BUE support on RW                            Cognition Arousal/Alertness: Awake/alert Behavior During Therapy: WFL for tasks assessed/performed Overall Cognitive Status: Within Functional Limits for tasks assessed                                 General Comments: talkative throughout session but easily redirected      Exercises Total Joint Exercises Ankle Circles/Pumps: AROM;Both;10 reps Quad Sets: AROM;Both;10 reps Short Arc Quad: Strengthening;Both;10 reps;AROM;Right;AAROM;Left Heel Slides: AAROM;Strengthening;Left;10 reps;AROM;Right Hip ABduction/ADduction: AAROM;Strengthening;Left;10 reps;AROM;Right    General Comments      Pertinent Vitals/Pain Pain Assessment: Faces Faces Pain Scale: No hurt Pain Location: L groin/thigh, chronic back pain Pain Descriptors / Indicators: Discomfort;Tender Pain Intervention(s): Limited activity within patient's tolerance;Repositioned;Monitored during session    Home Living                      Prior Function            PT Goals (current goals can now be found in the care plan section) Progress towards PT goals: Progressing toward goals    Frequency    BID      PT Plan Current plan remains appropriate    Co-evaluation  AM-PAC PT "6 Clicks" Mobility   Outcome Measure  Help needed turning from your back to your side while in a flat bed without using bedrails?: A Lot Help needed moving from lying on your back to sitting on the side of a flat bed without using bedrails?: A Lot Help needed moving to and from a bed to a chair (including a wheelchair)?: Total Help needed standing up from a chair using your arms (e.g., wheelchair or bedside chair)?: Total Help needed to walk in hospital room?: Total Help needed climbing 3-5 steps with a  railing? : Total 6 Click Score: 8    End of Session Equipment Utilized During Treatment: Gait belt Activity Tolerance: Patient tolerated treatment well Patient left: in bed;with call bell/phone within reach;with bed alarm set Nurse Communication: Mobility status PT Visit Diagnosis: Muscle weakness (generalized) (M62.81);Difficulty in walking, not elsewhere classified (R26.2);Other abnormalities of gait and mobility (R26.89)     Time: 1216-1227 PT Time Calculation (min) (ACUTE ONLY): 11 min  Charges:  $Therapeutic Exercise: 8-22 mins                     Lieutenant Diego PT, DPT 1:56 PM,09/03/20

## 2020-09-03 NOTE — Plan of Care (Signed)
No acute events overnight.  Problem: Education: Goal: Knowledge of General Education information will improve Description: Including pain rating scale, medication(s)/side effects and non-pharmacologic comfort measures Outcome: Progressing   Problem: Health Behavior/Discharge Planning: Goal: Ability to manage health-related needs will improve Outcome: Progressing   Problem: Clinical Measurements: Goal: Ability to maintain clinical measurements within normal limits will improve Outcome: Progressing Goal: Will remain free from infection Outcome: Progressing Goal: Diagnostic test results will improve Outcome: Progressing Goal: Respiratory complications will improve Outcome: Progressing Goal: Cardiovascular complication will be avoided Outcome: Progressing   Problem: Activity: Goal: Risk for activity intolerance will decrease Outcome: Progressing   Problem: Nutrition: Goal: Adequate nutrition will be maintained Outcome: Progressing   Problem: Coping: Goal: Level of anxiety will decrease Outcome: Progressing   Problem: Elimination: Goal: Will not experience complications related to bowel motility Outcome: Progressing Goal: Will not experience complications related to urinary retention Outcome: Progressing   Problem: Pain Managment: Goal: General experience of comfort will improve Outcome: Progressing   Problem: Safety: Goal: Ability to remain free from injury will improve Outcome: Progressing   Problem: Skin Integrity: Goal: Risk for impaired skin integrity will decrease Outcome: Progressing   Problem: Education: Goal: Knowledge of the prescribed therapeutic regimen will improve Outcome: Progressing Goal: Understanding of discharge needs will improve Outcome: Progressing   Problem: Activity: Goal: Ability to avoid complications of mobility impairment will improve Outcome: Progressing   Problem: Clinical Measurements: Goal: Postoperative complications will be  avoided or minimized Outcome: Progressing   Problem: Pain Management: Goal: Pain level will decrease with appropriate interventions Outcome: Progressing   Problem: Skin Integrity: Goal: Will show signs of wound healing Outcome: Progressing

## 2020-09-03 NOTE — Progress Notes (Signed)
PROGRESS NOTE    Loreto Loescher  QIH:[id number] DOB: [birth year] DOA: 08/30/2020 [facility]    Assessment & Plan:   Principal Problem:   Transcervical fracture of left femur, closed, initial encounter ([place]) Active Problems:   Chronic gouty arthropathy with tophus (tophi)   Idiopathic peripheral neuropathy   Accidental fall   Preoperative clearance   Osteopenia determined by x-ray   Closed left hip fracture, initial encounter ([place])   Acute kidney injury superimposed on CKD ([place])   Closed fracture of lumbar vertebral body (HCC)   Blood pressure elevated without history of HTN    Curren Mohrmann is a 69 y.o. male with medical history significant for tophaceous gout, peripheral neuropathy, hypothyroidism who presents to the emergency room following a fall in which his foot caught on something as he turned around while standing causing him to fall onto his left side.  Since the fall he has been having intractable pain in his left hip and low back.  He denied hitting his head and had no loss of consciousness and denies other injury.  He was previously in his usual state of health.     Transcervical fracture of left femur, closed, initial encounter  S/p left TOTAL HIP ARTHROPLASTY on 08/31/20 -Patient sustained accidental fall after he tripped while turning around in room.  Had no preceding symptoms -X-ray showing transcervical fracture of the left femur as well as osteopenia PLAN: -Pain management, per ortho --PT/OT rec SNF --weight-bearing as tolerated to left leg --Follow-up with [facility] in 2 weeks --TED hose bilateral lower extremity x6 weeks.  Okay to remove at nighttime. --remove provena negative pressure dressing on 09/11/2020 and apply honey comb dressing. Keep dressing clean and dry at all times. --Lovenox 40 mg subcu daily x14 days.  Suspect Acute closed fracture of L1 vertebral body (HCC) Multiple compression deformities of the thoracolumbar spine. -CT  lumbar spine shows above --conservative management for now, per Ortho, Dr. Rudene Christians  Post-op anemia, likely due to blood loss --Hgb dropped to 6.9 on 11/1, s/p 1u pRBC --Monitor and transfuse to keep Hgb >7  Osteopenia determined by x-ray --vit D and Ca supplements started by ortho, continue  AKI on CKD 3a(HCC) -Creatinine 1.96 on presentation but no recent creatinine on file.  Cr trended down to 1.54 with IVF. --Hold MIVF and encourage oral hydration  Elevated BP -Patient with no prior history of hypertension with blood pressure 200/103 in the ER, likely due to pain.  Post-op, BP down to 100's-120's. --IV labetalol PRN    Chronic gouty arthropathy with tophus (tophi) -Per pt, he had acute gout attack after taking allopurinol -Colchicine and prednisone as needed --Per ortho, will not start gout tx while inpatient, and will refer to Rheum as outpatient    Idiopathic peripheral neuropathy -Appears stable     DVT prophylaxis: Lovenox SQ Code Status: Full code  Family Communication:  Status is: inpatient Dispo:   The patient is from: home Anticipated d/c is to: SNF rehab Anticipated d/c date is: whenever bed available.  Patient currently is medically stable to d/c.   Subjective and Interval History:  Hgb rose appropriately after 1u pRBC yesterday.  Pt reported feeling better.  Working on [employer].  No new complaints.   Objective: Vitals:   09/03/20 0001 09/03/20 0737 09/03/20 1204 09/03/20 1605  BP: 103/61 (!) 111/51 95/65 110/65  Pulse: 83 83 86 78  Resp: 17 18 18 18   Temp: 98 F (36.7 C) 98.4 F (36.9 C) 98.2  F (36.8 C) 98.5 F (36.9 C)  TempSrc: Oral Oral Oral Oral  SpO2: 100% 100% 100% 98%  Weight:      Height:        Intake/Output Summary (Last 24 hours) at 09/03/2020 1723 Last data filed at 09/03/2020 1023 Gross per 24 hour  Intake 240 ml  Output 1350 ml  Net -1110 ml   Filed Weights   08/30/20 1741 09/01/20 0645  Weight: 77.1 kg 81.6 kg     Examination:   Constitutional: NAD, AAOx3 HEENT: conjunctivae and lids normal, EOMI CV: No cyanosis.   RESP: normal respiratory effort, on RA Extremities: No effusions, edema in BLE SKIN: warm, dry and intact.  Numerous gout nodules over finger joints and elbow Neuro: II - XII grossly intact.     Data Reviewed: I have personally reviewed following labs and imaging studies  CBC: Recent Labs  Lab 08/30/20 1756 08/31/20 0708 09/01/20 0258 09/02/20 0340 09/03/20 0338  WBC 6.7 4.4 4.4 3.4* 2.7*  NEUTROABS 5.7  --   --   --   --   HGB 10.6* 9.5* 7.6* 6.9* 7.8*  HCT 29.9* 27.2* 21.9* 20.1* 22.4*  MCV 100.3* 101.5* 100.0 101.0* 97.0  PLT 165 142* 140* 121* 601*   Basic Metabolic Panel: Recent Labs  Lab 08/30/20 1756 08/31/20 0708 09/01/20 0258 09/02/20 0340 09/03/20 0338  NA 135 136 132* 129* 129*  K 3.7 3.5 3.1* 3.6 3.6  CL 101 104 102 100 100  CO2 18* 20* 21* 20* 23  GLUCOSE 115* 82 105* 94 95  BUN 26* 23 19 20  25*  CREATININE 1.96* 1.64* 1.54* 1.56* 1.64*  CALCIUM 8.9 8.8* 8.1* 8.1* 8.3*  MG  --   --  1.4* 2.2 2.0   GFR: Estimated Creatinine Clearance: 45.3 mL/min (A) (by C-G formula based on SCr of 1.64 mg/dL (H)). Liver Function Tests: Recent Labs  Lab 08/30/20 1756  AST 46*  ALT 38  ALKPHOS 101  BILITOT 3.0*  PROT 8.1  ALBUMIN 3.6   No results for input(s): LIPASE, AMYLASE in the last 168 hours. No results for input(s): AMMONIA in the last 168 hours. Coagulation Profile: Recent Labs  Lab 08/30/20 1756  INR 1.0   Cardiac Enzymes: No results for input(s): CKTOTAL, CKMB, CKMBINDEX, TROPONINI in the last 168 hours. BNP (last 3 results) No results for input(s): PROBNP in the last 8760 hours. HbA1C: No results for input(s): HGBA1C in the last 72 hours. CBG: No results for input(s): GLUCAP in the last 168 hours. Lipid Profile: No results for input(s): CHOL, HDL, LDLCALC, TRIG, CHOLHDL, LDLDIRECT in the last 72 hours. Thyroid Function  Tests: No results for input(s): TSH, T4TOTAL, FREET4, T3FREE, THYROIDAB in the last 72 hours. Anemia Panel: No results for input(s): VITAMINB12, FOLATE, FERRITIN, TIBC, IRON, RETICCTPCT in the last 72 hours. Sepsis Labs: No results for input(s): PROCALCITON, LATICACIDVEN in the last 168 hours.  Recent Results (from the past 240 hour(s))  Respiratory Panel by RT PCR (Flu A&B, Covid) - Nasopharyngeal Swab     Status: None   Collection Time: 08/30/20 10:30 PM   Specimen: Nasopharyngeal Swab  Result Value Ref Range Status   SARS Coronavirus 2 by RT PCR NEGATIVE NEGATIVE Final    Comment: (NOTE) SARS-CoV-2 target nucleic acids are NOT DETECTED.  The SARS-CoV-2 RNA is generally detectable in upper respiratoy specimens during the acute phase of infection. The lowest concentration of SARS-CoV-2 viral copies this assay can detect is 131 copies/mL. A negative result does  not preclude SARS-Cov-2 infection and should not be used as the sole basis for treatment or other patient management decisions. A negative result may occur with  improper specimen collection/handling, submission of specimen other than nasopharyngeal swab, presence of viral mutation(s) within the areas targeted by this assay, and inadequate number of viral copies (<131 copies/mL). A negative result must be combined with clinical observations, patient history, and epidemiological information. The expected result is Negative.  Fact Sheet for Patients:  PinkCheek.be  Fact Sheet for Healthcare Providers:  GravelBags.it  This test is no t yet approved or cleared by the [place] FDA and  has been authorized for detection and/or diagnosis of SARS-CoV-2 by FDA under an Emergency Use Authorization (EUA). This EUA will remain  in effect (meaning this test can be used) for the duration of the COVID-19 declaration under Section 564(b)(1) of the Act, 21 U.S.C. section  360bbb-3(b)(1), unless the authorization is terminated or revoked sooner.     Influenza A by PCR NEGATIVE NEGATIVE Final   Influenza B by PCR NEGATIVE NEGATIVE Final    Comment: (NOTE) The Xpert Xpress SARS-CoV-2/FLU/RSV assay is intended as an aid in  the diagnosis of influenza from Nasopharyngeal swab specimens and  should not be used as a sole basis for treatment. Nasal washings and  aspirates are unacceptable for Xpert Xpress SARS-CoV-2/FLU/RSV  testing.  Fact Sheet for Patients: PinkCheek.be  Fact Sheet for Healthcare Providers: GravelBags.it  This test is not yet approved or cleared by the [place] FDA and  has been authorized for detection and/or diagnosis of SARS-CoV-2 by  FDA under an Emergency Use Authorization (EUA). This EUA will remain  in effect (meaning this test can be used) for the duration of the  Covid-19 declaration under Section 564(b)(1) of the Act, 21  U.S.C. section 360bbb-3(b)(1), unless the authorization is  terminated or revoked. Performed at [facility], [address] [place], [place] [address]       Radiology Studies: No results found.   Scheduled Meds: . sodium chloride   Intravenous Once  . calcium carbonate  200 mg of elemental calcium Oral Daily   And  . cholecalciferol  800 Units Oral Daily  . Chlorhexidine Gluconate Cloth  6 each Topical Daily  . docusate sodium  100 mg Oral BID  . enoxaparin (LOVENOX) injection  40 mg Subcutaneous Q24H  . feeding supplement  237 mL Oral BID BM  . ferrous [id number] C-folic acid  1 capsule Oral BID PC  . nutrition supplement (JUVEN)  1 packet Oral BID BM  . pantoprazole  40 mg Oral BID  . polyethylene glycol  34 g Oral BID   Continuous Infusions: . methocarbamol (ROBAXIN) IV    . tranexamic acid       LOS: 4 days     Enzo Bi, MD [facility] If 7PM-7AM, please contact night-coverage 09/03/2020,  5:23 PM

## 2020-09-03 NOTE — Progress Notes (Addendum)
   Subjective: 3 Days Post-Op Procedure(s) (LRB): TOTAL HIP ARTHROPLASTY ANTERIOR APPROACH (Left) Patient reports pain as mild.   Patient is well, and has had no acute complaints or problems.  Denies any CP, SOB, ABD pain. We will continue with physical therapy today.  Objective: Vital signs in last 24 hours: Temp:  [97.6 F (36.4 C)-98.3 F (36.8 C)] 98 F (36.7 C) (11/02 0001) Pulse Rate:  [73-85] 83 (11/02 0001) Resp:  [16-18] 17 (11/02 0001) BP: (99-113)/(52-68) 103/61 (11/02 0001) SpO2:  [98 %-100 %] 100 % (11/02 0001)  Intake/Output from previous day: 11/01 0701 - 11/02 0700 In: 2150 [P.O.:1560; I.V.:250; Blood:340] Out: 1200 [Urine:1200] Intake/Output this shift: No intake/output data recorded.  Recent Labs    08/31/20 0708 09/01/20 0258 09/02/20 0340 09/03/20 0338  HGB 9.5* 7.6* 6.9* 7.8*   Recent Labs    09/02/20 0340 09/03/20 0338  WBC 3.4* 2.7*  RBC 1.99* 2.31*  HCT 20.1* 22.4*  PLT 121* 129*   Recent Labs    09/02/20 0340 09/03/20 0338  NA 129* 129*  K 3.6 3.6  CL 100 100  CO2 20* 23  BUN 20 25*  CREATININE 1.56* 1.64*  GLUCOSE 94 95  CALCIUM 8.1* 8.3*   No results for input(s): LABPT, INR in the last 72 hours.  EXAM General - Patient is Alert, Appropriate and Oriented Extremity - Neurovascular intact Sensation intact distally Intact pulses distally Incision: dressing C/D/I and no drainage No cellulitis present Compartment soft Mild swelling and tenderness to both ankles.  Patient is able to slightly ankle plantarflex and dorsiflex bilaterally  Ecchymosis present to the left posterior thigh. Dressing - dressing C/D/I and no drainage Praveena intact without drainage Motor Function - intact, moving foot and toes well on exam.   Past Medical History:  Diagnosis Date  . Chronic tophaceous gout   . Peripheral neuropathy     Assessment/Plan:   3 Days Post-Op Procedure(s) (LRB): TOTAL HIP ARTHROPLASTY ANTERIOR APPROACH  (Left) Principal Problem:   Transcervical fracture of left femur, closed, initial encounter ([place]) Active Problems:   Chronic gouty arthropathy with tophus (tophi)   Idiopathic peripheral neuropathy   Accidental fall   Preoperative clearance   Osteopenia determined by x-ray   Closed left hip fracture, initial encounter ([place])   Acute kidney injury superimposed on CKD (HCC)   Closed fracture of lumbar vertebral body (HCC)   Blood pressure elevated without history of HTN  Estimated body mass index is 24.41 kg/m as calculated from the following:   Height as of this encounter: 6' (1.829 m).   Weight as of this encounter: 81.6 kg. Advance diet Up with therapy  Work on bowel movement Acute postop blood loss anemia -hemoglobin 7.8 status post 1 unit of packed red blood cells 09/02/2020.  Recheck hemoglobin in the morning. Care management to assist with discharge to skilled nursing facility   Follow-up with [facility] orthopedics in 2 weeks TED hose bilateral lower extremity x6 weeks.  Okay to remove at nighttime. Please remove provena negative pressure dressing on 09/11/2020 and apply honey comb dressing. Keep dressing clean and dry at all times. Lovenox 40 mg subcu daily x14 days.   DVT Prophylaxis - Lovenox, TED hose and SCDs Weight-Bearing as tolerated to left leg   T. Rachelle Hora, PA-C [place] 09/03/2020, 7:06 AM

## 2020-09-04 LAB — BASIC METABOLIC PANEL
Anion gap: 7 (ref 5–15)
BUN: 27 mg/dL — ABNORMAL HIGH (ref 8–23)
CO2: 25 mmol/L (ref 22–32)
Calcium: 8.5 mg/dL — ABNORMAL LOW (ref 8.9–10.3)
Chloride: 101 mmol/L (ref 98–111)
Creatinine, Ser: 1.63 mg/dL — ABNORMAL HIGH (ref 0.61–1.24)
GFR, Estimated: 45 mL/min — ABNORMAL LOW (ref >60)
Glucose, Bld: 93 mg/dL (ref 70–99)
Potassium: 3.7 mmol/L (ref 3.5–5.1)
Sodium: 133 mmol/L — ABNORMAL LOW (ref 135–145)

## 2020-09-04 LAB — CBC
HCT: 23 % — ABNORMAL LOW (ref 39.0–52.0)
Hemoglobin: 7.8 g/dL — ABNORMAL LOW (ref 13.0–17.0)
MCH: 33.6 pg (ref 26.0–34.0)
MCHC: 33.9 g/dL (ref 30.0–36.0)
MCV: 99.1 fL (ref 80.0–100.0)
Platelets: 153 10*3/uL (ref 150–400)
RBC: 2.32 MIL/uL — ABNORMAL LOW (ref 4.22–5.81)
RDW: 15.2 % (ref 11.5–15.5)
WBC: 2.7 10*3/uL — ABNORMAL LOW (ref 4.0–10.5)
nRBC: 0 % (ref 0.0–0.2)

## 2020-09-04 LAB — SURGICAL PATHOLOGY

## 2020-09-04 LAB — SARS CORONAVIRUS 2 BY RT PCR (HOSPITAL ORDER, PERFORMED IN ~~LOC~~ HOSPITAL LAB): SARS Coronavirus 2: NEGATIVE

## 2020-09-04 LAB — MAGNESIUM: Magnesium: 2.2 mg/dL (ref 1.7–2.4)

## 2020-09-04 MED ORDER — FE FUMARATE-B12-VIT C-FA-IFC PO CAPS
1.0000 | ORAL_CAPSULE | Freq: Two times a day (BID) | ORAL | 1 refills | Status: DC
Start: 2020-09-04 — End: 2021-02-04

## 2020-09-04 MED ORDER — JUVEN PO PACK
1.0000 | PACK | Freq: Two times a day (BID) | ORAL | 0 refills | Status: DC
Start: 2020-09-04 — End: 2021-02-04

## 2020-09-04 MED ORDER — CALCIUM CARBONATE ANTACID 500 MG PO CHEW
200.0000 mg | CHEWABLE_TABLET | Freq: Every day | ORAL | 0 refills | Status: DC
Start: 2020-09-05 — End: 2024-05-31

## 2020-09-04 MED ORDER — BACITRACIN-NEOMYCIN-POLYMYXIN 400-5-5000 EX OINT
TOPICAL_OINTMENT | Freq: Two times a day (BID) | CUTANEOUS | Status: DC
  Administered 2020-09-04: 1 via TOPICAL
  Filled 2020-09-04 (×2): qty 1

## 2020-09-04 MED ORDER — CHOLECALCIFEROL 10 MCG (400 UNIT) PO TABS: 800.0000 [IU] | ORAL_TABLET | Freq: Every day | ORAL | 0 refills | Status: DC

## 2020-09-04 MED ORDER — ENSURE ENLIVE PO LIQD
237.0000 mL | Freq: Two times a day (BID) | ORAL | 12 refills | Status: DC
Start: 2020-09-04 — End: 2021-02-04

## 2020-09-04 NOTE — Plan of Care (Signed)

## 2020-09-04 NOTE — Progress Notes (Signed)
Pt discharged via EMS. Discharge paperwork given to EMS for facility.  VSS, No s/s of distress.

## 2020-09-04 NOTE — Progress Notes (Signed)
Physical Therapy Treatment Patient Details Name: Mark Jimenez MRN: [id number] DOB: 1950/12/31 Today's Date: 09/04/2020    History of Present Illness Pt is a 69 y/o M admitted on 08/30/20 s/p fall onto his L side now with intractable pain in L hip & low back. X ray revealed transcervical fx of L hip, xray then CT of lumbar spine suspect for acute inferior endplate fx at L1 that involves anterior vertebral body. With regard to lumbar fx, if it limits his ability to ambulate pt/ortho to consider kyphoplasty. Pt now s/p L THA (anterior approach) on 08/31/20. PMH: gout, peripheral neuropathy, hypothyroidism    PT Comments    Pt required increased time to complete tasks this am. Initially, discussed POC with pt and potential d/c plans for today to transition to SNF prior to returning home.  This am treatment session focused on ROM exercises for B LE's. Pt required MaxA to transition supine<>sit EOB due to weakness and Left hip discomfort.  ModA to scoot to EOB. Pt tolerated 21minutes sitting at edge while completing dynamic reaching activities with both feet on floor.  Attempted standing several times from raised bed with MaxA and support of RW. Pt was able to achieve modified flexed forward stance x 30 seconds, WBAT L LE, and ModA to maintain balance/safety.  Pt limited by bilateral shoulder pain.  Pt required MaxA to return to supine in bed and boost up in bed for improved comfort.  Bed alarm on, bed lowered, and call bell within reach.  Overall, pt very motivated to improve and willing to participate.   Follow Up Recommendations        Equipment Recommendations       Recommendations for Other Services       Precautions / Restrictions Precautions Precautions: Fall Precaution Comments: (anterior THA, no precautions) Restrictions Weight Bearing Restrictions: Yes LLE Weight Bearing: Weight bearing as tolerated    Mobility  Bed Mobility Overal bed mobility: Needs Assistance Bed Mobility:  Supine to Sit;Sit to Supine Rolling: Max assist   Supine to sit: Max assist;HOB elevated Sit to supine: Max assist   General bed mobility comments: limited due to comorbidities   Transfers Overall transfer level: Needs assistance Equipment used: Rolling walker (2 wheeled) Transfers: Sit to/from Stand Sit to Stand: Max assist         General transfer comment: Bed raised in order to transition to standing. Pt tolerated ~30 seconds static standing at RW with ModA to maintain. Fexed forward posture, flexed knees and hips.  Ambulation/Gait             General Gait Details: pt unable   Stairs             Wheelchair Mobility    Modified Rankin (Stroke Patients Only)       Balance                                            Cognition Arousal/Alertness: Awake/alert Behavior During Therapy: WFL for tasks assessed/performed Overall Cognitive Status: Within Functional Limits for tasks assessed                                 General Comments: talkative throughout session but easily redirected      Exercises General Exercises - Lower Extremity Ankle Circles/Pumps: AROM;Both;15 reps Heel Slides:  AAROM;5 reps;Left    General Comments        Pertinent Vitals/Pain Pain Assessment: 0-10 Pain Score: 3  Pain Location: B shoulders upon attempting to stand Pain Descriptors / Indicators: Sore Pain Intervention(s): Limited activity within patient's tolerance    Home Living                      Prior Function            PT Goals (current goals can now be found in the care plan section) Acute Rehab PT Goals Patient Stated Goal: to get better Progress towards PT goals: Progressing toward goals    Frequency    BID      PT Plan Current plan remains appropriate    Co-evaluation              AM-PAC PT "6 Clicks" Mobility   Outcome Measure  Help needed turning from your back to your side while in a flat  bed without using bedrails?: A Lot Help needed moving from lying on your back to sitting on the side of a flat bed without using bedrails?: A Lot Help needed moving to and from a bed to a chair (including a wheelchair)?: Total Help needed standing up from a chair using your arms (e.g., wheelchair or bedside chair)?: Total Help needed to walk in hospital room?: Total Help needed climbing 3-5 steps with a railing? : Total 6 Click Score: 8    End of Session Equipment Utilized During Treatment: Gait belt Activity Tolerance: Patient tolerated treatment well Patient left: in bed;with call bell/phone within reach;with bed alarm set Nurse Communication: Mobility status PT Visit Diagnosis: Muscle weakness (generalized) (M62.81);Difficulty in walking, not elsewhere classified (R26.2);Other abnormalities of gait and mobility (R26.89)     Time: 7948-0165 PT Time Calculation (min) (ACUTE ONLY): 50 min  Charges:  $Therapeutic Activity: 38-52 mins                     [name], PTA   [name] 09/04/2020, 12:54 PM

## 2020-09-04 NOTE — Discharge Summary (Signed)
Mark Jimenez at [place] NAME: Mark Jimenez    MR#:  [id number]  DATE OF BIRTH:  1950/12/16  DATE OF ADMISSION:  08/30/2020 ADMITTING PHYSICIAN: Mark Jimenez  DATE OF DISCHARGE: 09/04/2020  PRIMARY CARE PHYSICIAN: [facility]    ADMISSION DIAGNOSIS:  Fall [W19.XXXA] Left leg pain [M79.605] AKI (acute kidney injury) ([place]) [N17.9] Pre-op chest exam [F68.127] Tophaceous gout [M1A.9XX1] Displaced fracture of left femoral neck ([place]) [S72.002A] Fall, initial encounter [W19.XXXA] Closed left hip fracture, initial encounter ([place]) [S72.002A] Hip fx, left, closed, initial encounter ([place]) [S72.002A]  DISCHARGE DIAGNOSIS:  Left Femur fracture s/p left Hip Total arthroplasty Acute closed fracture of L1 vertebrae SECONDARY DIAGNOSIS:   Past Medical History:  Diagnosis Date   Chronic tophaceous gout    Peripheral neuropathy     HOSPITAL COURSE:   Mark Jimenez medical history significant fortophaceousgout, peripheral neuropathy, hypothyroidism who presents to the emergency room following a fall in which his foot caught on something as he turned around while standing causing him to fall onto his left side. Since the fall he has been having intractable pain in his left hip and low back. He denied hitting his head and had no loss of consciousness and denies other injury. He was previously in his usual state of health.    Acute fracture of left femur, closed, initial encounter  S/p left TOTAL HIP ARTHROPLASTY on 08/31/20 -Patient sustained accidental fall after he tripped while turning around in room. Had no preceding symptoms -X-ray showing transcervical fracture of the left femur as well as osteopenia -Pain management, per ortho --PT/OT rec SNF--[facility] --Follow-up with [facility] orthopedics in 2 weeks. --remove provena negative pressure dressing on11/10/2021and apply honey comb dressing. Keep  dressing clean and dry at all times. --Lovenox 40 mg subcu daily x14 days per ortho  SuspectAcute closed fracture ofL1vertebral body (HCC) Multiple compression deformities of the thoracolumbar spine. -CT lumbar spine shows above --conservative management for now, per Ortho, Mark Jimenez  Post-op anemia, likely due to blood loss --Hgb dropped to 6.9 on 11/1, s/p 1u pRBC--hgb 7.8  Osteopenia determined by x-ray --vit D and Ca supplements started by ortho, continue  AKI on CKD 3a(HCC) -Creatinine 1.96 on presentation but no recent creatinine on file.  Cr trended down to 1.54 with IVF.  Elevated BP -Patient with no prior history of hypertension with blood pressure 200/103 in the ER, likely due to pain.  Post-op, BP down to 100's-120's--no indication for po meds at discharge --IV labetalol PRN  Chronic gouty arthropathy with tophus (tophi) -Per pt, he had acute gout attack after taking allopurinol -Colchicine and prednisone as needed  Idiopathic peripheral neuropathy -Appears stable   DVT prophylaxis: Lovenox SQ Code Status: Full code  Family Communication:  Status is: inpatient Dispo:   The patient is from: home Anticipated d/c is to: SNF rehab Anticipated d/c date is: today to [facility]. Pt agreeable. Ok from ortho side for d/c  Patient currently is medically stable to d/c.  CONSULTS OBTAINED:  Treatment Team:  Mark Knows, Jimenez  Mark Jimenez:  Not on File  DISCHARGE MEDICATIONS:   Jimenez as of 09/04/2020   Not on File     Medication List    TAKE these medications   calcium carbonate 500 MG chewable tablet Commonly known as: TUMS - dosed in mg elemental calcium Chew 1 tablet (200 mg of elemental calcium total) by mouth daily. Start taking on: September 05, 2020  cholecalciferol 10 MCG (400 UNIT) Tabs tablet Commonly known as: VITAMIN D3 Take 2 tablets (800 Units total) by mouth daily. Start taking on: September 05, 2020   colchicine 0.6  MG tablet Take 0.6 mg by mouth daily as needed (acute gout flare).   enoxaparin 40 MG/0.4ML injection Commonly known as: LOVENOX Inject 0.4 mLs (40 mg total) into the skin daily for 14 days.   feeding supplement Liqd Take 237 mLs by mouth 2 (two) times daily between meals.   nutrition supplement (JUVEN) Pack Take 1 packet by mouth 2 (two) times daily between meals.   ferrous POEUMPNT-I14-ERXVQMG C-folic acid capsule Commonly known as: TRINSICON / FOLTRIN Take 1 capsule by mouth 2 (two) times daily after a meal.   HYDROcodone-acetaminophen 5-325 MG tablet Commonly known as: NORCO/VICODIN Take 1-2 tablets by mouth every 6 (six) hours as needed for moderate pain.   ibuprofen 200 MG tablet Commonly known as: ADVIL Take 400-600 mg by mouth every 6 (six) hours as needed for mild pain or moderate pain.   indomethacin 50 MG capsule Commonly known as: INDOCIN Take 50 mg by mouth 3 (three) times daily as needed for mild pain or moderate pain.   Omeprazole 20 MG Tbdd Take 20 mg by mouth daily.            Discharge Care Instructions  (From admission, onward)         Start     Ordered   09/04/20 0000  Discharge wound care:       Comments: Per Ortho--Please remove provena negative pressure dressing on 09/11/2020 and apply honey comb dressing. Keep dressing clean and dry at all times.   09/04/20 0947          If you experience worsening of your admission symptoms, develop shortness of breath, life threatening emergency, suicidal or homicidal thoughts you must seek medical attention immediately by calling 911 or calling your Jimenez immediately  if symptoms less severe.  You Must read complete instructions/literature along with all the possible adverse reactions/side effects for all the Medicines you take and that have been prescribed to you. Take any new Medicines after you have completely understood and accept all the possible adverse reactions/side effects.   Please note  You  were cared for by a hospitalist during your hospital stay. If you have any questions about your discharge medications or the care you received while you were in the hospital after you are discharged, you can call the unit and asked to speak with the hospitalist on call if the hospitalist that took care of you is not available. Once you are discharged, your primary care physician will handle any further medical issues. Please note that NO REFILLS for any discharge medications will be authorized once you are discharged, as it is imperative that you return to your primary care physician (or establish a relationship with a primary care physician if you do not have one) for your aftercare needs so that they can reassess your need for medications and monitor your lab values. Today   SUBJECTIVE  Had good BM--does not want anymore stool softener   VITAL SIGNS:  Blood pressure (!) 116/58, pulse 87, temperature 98.3 F (36.8 C), temperature source Oral, resp. rate 16, height 6' (1.829 m), weight 81 kg, SpO2 100 %.  I/O:    Intake/Output Summary (Last 24 hours) at 09/04/2020 1046 Last data filed at 09/04/2020 0930 Gross per 24 hour  Intake --  Output 1350 ml  Net -1350 ml  PHYSICAL EXAMINATION:  GENERAL:  69 y.o.-year-old patient lying in the bed with no acute distress.   HEENT: Head atraumatic, normocephalic. Oropharynx and nasopharynx clear.  NECK:  Supple, no jugular venous distention. No thyroid enlargement, no tenderness.  LUNGS: Normal breath sounds bilaterally, no wheezing, rales,rhonchi or crepitation. No use of accessory muscles of respiration.  CARDIOVASCULAR: S1, S2 normal. No murmurs, rubs, or gallops.  ABDOMEN: Soft, non-tender, non-distended. Bowel sounds present. No organomegaly or mass.  EXTREMITIES: No pedal edema, cyanosis, or clubbing. 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 Muscle strength 5/5 in all extremities. Sensation intact. Gait  not checked.  PSYCHIATRIC: The patient is alert and oriented x 3.  SKIN: No obvious rash, lesion, or ulcer.   DATA REVIEW:   CBC  Recent Labs  Lab 09/04/20 0402  WBC 2.7*  HGB 7.8*  HCT 23.0*  PLT 153    Chemistries  Recent Labs  Lab 08/30/20 1756 08/31/20 0708 09/04/20 0402  NA 135   < > 133*  K 3.7   < > 3.7  CL 101   < > 101  CO2 18*   < > 25  GLUCOSE 115*   < > 93  BUN 26*   < > 27*  CREATININE 1.96*   < > 1.63*  CALCIUM 8.9   < > 8.5*  MG  --    < > 2.2  AST 46*  --   --   ALT 38  --   --   ALKPHOS 101  --   --   BILITOT 3.0*  --   --    < > = values in this interval not displayed.    Microbiology Results   Recent Results (from the past 240 hour(s))  Respiratory Panel by RT PCR (Flu A&B, Covid) - Nasopharyngeal Swab     Status: None   Collection Time: 08/30/20 10:30 PM   Specimen: Nasopharyngeal Swab  Result Value Ref Range Status   SARS Coronavirus 2 by RT PCR NEGATIVE NEGATIVE Final    Comment: (NOTE) SARS-CoV-2 target nucleic acids are NOT DETECTED.  The SARS-CoV-2 RNA is generally detectable in upper respiratoy specimens during the acute phase of infection. The lowest concentration of SARS-CoV-2 viral copies this assay can detect is 131 copies/mL. A negative result does not preclude SARS-Cov-2 infection and should not be used as the sole basis for treatment or other patient management decisions. A negative result may occur with  improper specimen collection/handling, submission of specimen other than nasopharyngeal swab, presence of viral mutation(s) within the areas targeted by this assay, and inadequate number of viral copies (<131 copies/mL). A negative result must be combined with clinical observations, patient history, and epidemiological information. The expected result is Negative.  Fact Sheet for Patients:  PinkCheek.be  Fact Sheet for Healthcare Providers:   GravelBags.it  This test is no t yet approved or cleared by the [place] FDA and  has been authorized for detection and/or diagnosis of SARS-CoV-2 by FDA under an Emergency Use Authorization (EUA). This EUA will remain  in effect (meaning this test can be used) for the duration of the COVID-19 declaration under Section 564(b)(1) of the Act, 21 U.S.C. section 360bbb-3(b)(1), unless the authorization is terminated or revoked sooner.     Influenza A by PCR NEGATIVE NEGATIVE Final   Influenza B by PCR NEGATIVE NEGATIVE Final    Comment: (NOTE) The Xpert Xpress SARS-CoV-2/FLU/RSV assay is intended as an aid in  the diagnosis of influenza from Nasopharyngeal swab specimens and  should not be used as a sole basis for treatment. Nasal washings and  aspirates are unacceptable for Xpert Xpress SARS-CoV-2/FLU/RSV  testing.  Fact Sheet for Patients: PinkCheek.be  Fact Sheet for Healthcare Providers: GravelBags.it  This test is not yet approved or cleared by the [place] FDA and  has been authorized for detection and/or diagnosis of SARS-CoV-2 by  FDA under an Emergency Use Authorization (EUA). This EUA will remain  in effect (meaning this test can be used) for the duration of the  Covid-19 declaration under Section 564(b)(1) of the Act, 21  U.S.C. section 360bbb-3(b)(1), unless the authorization is  terminated or revoked. Performed at [facility], [address] [place], [place] [address]     RADIOLOGY:  No results found.   CODE STATUS:     Code Status Orders  (From admission, onward)         Start     Ordered   08/30/20 2144  Full code  Continuous        08/30/20 2145        Code Status History    This patient has a current code status but no historical code status.   Advance Care Planning Activity       TOTAL TIME TAKING CARE OF THIS PATIENT: 35 minutes.     Fritzi Mandes M.D  [facility]    CC: Primary care physician; [facility]

## 2020-09-04 NOTE — Progress Notes (Signed)
   Subjective: 4 Days Post-Op Procedure(s) (LRB): TOTAL HIP ARTHROPLASTY ANTERIOR APPROACH (Left) Patient reports pain as mild.   Patient is well, and has had no acute complaints or problems.  Denies any CP, SOB, ABD pain. We will continue with physical therapy today.  Objective: Vital signs in last 24 hours: Temp:  [98.2 F (36.8 C)-99.1 F (37.3 C)] 98.3 F (36.8 C) (11/03 0810) Pulse Rate:  [77-92] 87 (11/03 0810) Resp:  [16-18] 16 (11/03 0810) BP: (95-117)/(50-76) 116/58 (11/03 0810) SpO2:  [98 %-100 %] 100 % (11/03 0810) Weight:  [81 kg] 81 kg (11/03 0500)  Intake/Output from previous day: 11/02 0701 - 11/03 0700 In: -  Out: 900 [Urine:900] Intake/Output this shift: No intake/output data recorded.  Recent Labs    09/02/20 0340 09/03/20 0338 09/04/20 0402  HGB 6.9* 7.8* 7.8*   Recent Labs    09/03/20 0338 09/04/20 0402  WBC 2.7* 2.7*  RBC 2.31* 2.32*  HCT 22.4* 23.0*  PLT 129* 153   Recent Labs    09/03/20 0338 09/04/20 0402  NA 129* 133*  K 3.6 3.7  CL 100 101  CO2 23 25  BUN 25* 27*  CREATININE 1.64* 1.63*  GLUCOSE 95 93  CALCIUM 8.3* 8.5*   No results for input(s): LABPT, INR in the last 72 hours.  EXAM General - Patient is Alert, Appropriate and Oriented Extremity - Neurovascular intact Sensation intact distally Intact pulses distally Incision: dressing C/D/I and no drainage No cellulitis present Compartment soft Mild swelling and tenderness to both ankles.  Patient is able to slightly ankle plantarflex and dorsiflex bilaterally  Ecchymosis present to the left posterior thigh. Dressing - dressing C/D/I and no drainage Praveena intact without drainage Motor Function - intact, moving foot and toes well on exam.   Past Medical History:  Diagnosis Date  . Chronic tophaceous gout   . Peripheral neuropathy     Assessment/Plan:   4 Days Post-Op Procedure(s) (LRB): TOTAL HIP ARTHROPLASTY ANTERIOR APPROACH (Left) Principal Problem:    Transcervical fracture of left femur, closed, initial encounter ([place]) Active Problems:   Chronic gouty arthropathy with tophus (tophi)   Idiopathic peripheral neuropathy   Accidental fall   Preoperative clearance   Osteopenia determined by x-ray   Closed left hip fracture, initial encounter ([place])   Acute kidney injury superimposed on CKD (HCC)   Closed fracture of lumbar vertebral body (HCC)   Blood pressure elevated without history of HTN  Estimated body mass index is 24.22 kg/m as calculated from the following:   Height as of this encounter: 6' (1.829 m).   Weight as of this encounter: 81 kg. Advance diet Up with therapy  Work on bowel movement Acute postop blood loss anemia -hemoglobin 7.8 status post 1 unit of packed red blood cells 09/02/2020.   Care management to assist with discharge to skilled nursing facility   Follow-up with [facility] orthopedics in 2 weeks TED hose bilateral lower extremity x6 weeks.  Okay to remove at nighttime. Please remove provena negative pressure dressing on 09/11/2020 and apply honey comb dressing. Keep dressing clean and dry at all times. Lovenox 40 mg subcu daily x14 days.   DVT Prophylaxis - Lovenox, TED hose and SCDs Weight-Bearing as tolerated to left leg   T. Rachelle Hora, PA-C [place] 09/04/2020, 8:14 AM

## 2020-09-04 NOTE — TOC Transition Note (Addendum)
Transition of Care [facility]) - CM/SW Discharge Note   Patient Details  Name: Mark Jimenez MRN: [id number] Date of Birth: Jul 28, 1951  Transition of Care [facility]) CM/SW Contact:  Magnus Ivan, LCSW Phone Number: 09/04/2020, 10:50 AM   Clinical Narrative:   Patient to discharge to [facility] for rehab today. CSW confirmed with [employer]. Patient will be in Room E10. Updated patient, RN, and MD. Asked RN to call report. Medical Necessity Form and Facesheet placed in Discharge Packet by chart. COVID test ordered by MD for today. Will call EMS when informed by RN that patient is ready for discharge.  12:20- [facility] arranged for 1:30 pick up. RN aware.   Final next level of care: Skilled Nursing Facility Barriers to Discharge: Barriers Resolved   Patient Goals and CMS Choice Patient states their goals for this hospitalization and ongoing recovery are:: SNF rehab CMS Medicare.gov Compare Post Acute Care list provided to:: Patient Choice offered to / list presented to : Patient  Discharge Placement              Patient chooses bed at:  ([place], [place]) Patient to be transferred to facility by: EMS Name of family member notified: Patient notified. Patient and family notified of of transfer: 09/04/20  Discharge Plan and Services                                     Social Determinants of Health (SDOH) Interventions     Readmission Risk Interventions No flowsheet data found.

## 2020-12-20 ENCOUNTER — Encounter: Payer: Self-pay | Admitting: Internal Medicine

## 2020-12-23 ENCOUNTER — Ambulatory Visit: Payer: Medicare Other | Admitting: Internal Medicine

## 2021-02-03 ENCOUNTER — Other Ambulatory Visit: Payer: Self-pay | Admitting: *Deleted

## 2021-02-03 DIAGNOSIS — R339 Retention of urine, unspecified: Secondary | ICD-10-CM | POA: Diagnosis not present

## 2021-02-03 DIAGNOSIS — M47814 Spondylosis without myelopathy or radiculopathy, thoracic region: Secondary | ICD-10-CM | POA: Diagnosis not present

## 2021-02-03 DIAGNOSIS — L89612 Pressure ulcer of right heel, stage 2: Secondary | ICD-10-CM | POA: Diagnosis not present

## 2021-02-03 DIAGNOSIS — K219 Gastro-esophageal reflux disease without esophagitis: Secondary | ICD-10-CM | POA: Diagnosis not present

## 2021-02-03 DIAGNOSIS — R627 Adult failure to thrive: Secondary | ICD-10-CM | POA: Diagnosis not present

## 2021-02-03 DIAGNOSIS — K8020 Calculus of gallbladder without cholecystitis without obstruction: Secondary | ICD-10-CM | POA: Diagnosis not present

## 2021-02-03 DIAGNOSIS — D631 Anemia in chronic kidney disease: Secondary | ICD-10-CM | POA: Diagnosis not present

## 2021-02-03 DIAGNOSIS — N184 Chronic kidney disease, stage 4 (severe): Secondary | ICD-10-CM | POA: Diagnosis not present

## 2021-02-03 DIAGNOSIS — N179 Acute kidney failure, unspecified: Secondary | ICD-10-CM | POA: Diagnosis not present

## 2021-02-03 DIAGNOSIS — E039 Hypothyroidism, unspecified: Secondary | ICD-10-CM | POA: Diagnosis not present

## 2021-02-03 DIAGNOSIS — E872 Acidosis: Secondary | ICD-10-CM | POA: Diagnosis not present

## 2021-02-03 DIAGNOSIS — L719 Rosacea, unspecified: Secondary | ICD-10-CM | POA: Diagnosis not present

## 2021-02-03 DIAGNOSIS — S72032S Displaced midcervical fracture of left femur, sequela: Secondary | ICD-10-CM | POA: Diagnosis not present

## 2021-02-03 DIAGNOSIS — D649 Anemia, unspecified: Secondary | ICD-10-CM | POA: Diagnosis not present

## 2021-02-03 DIAGNOSIS — F5105 Insomnia due to other mental disorder: Secondary | ICD-10-CM | POA: Diagnosis not present

## 2021-02-03 DIAGNOSIS — F419 Anxiety disorder, unspecified: Secondary | ICD-10-CM | POA: Diagnosis not present

## 2021-02-03 DIAGNOSIS — M1611 Unilateral primary osteoarthritis, right hip: Secondary | ICD-10-CM | POA: Diagnosis not present

## 2021-02-03 DIAGNOSIS — L89622 Pressure ulcer of left heel, stage 2: Secondary | ICD-10-CM | POA: Diagnosis not present

## 2021-02-03 DIAGNOSIS — I9589 Other hypotension: Secondary | ICD-10-CM | POA: Diagnosis not present

## 2021-02-03 DIAGNOSIS — M1A00X1 Idiopathic chronic gout, unspecified site, with tophus (tophi): Secondary | ICD-10-CM | POA: Diagnosis not present

## 2021-02-03 DIAGNOSIS — L89152 Pressure ulcer of sacral region, stage 2: Secondary | ICD-10-CM | POA: Diagnosis not present

## 2021-02-03 DIAGNOSIS — R69 Illness, unspecified: Secondary | ICD-10-CM | POA: Diagnosis not present

## 2021-02-03 DIAGNOSIS — Z466 Encounter for fitting and adjustment of urinary device: Secondary | ICD-10-CM | POA: Diagnosis not present

## 2021-02-03 DIAGNOSIS — M1A30X0 Chronic gout due to renal impairment, unspecified site, without tophus (tophi): Secondary | ICD-10-CM | POA: Diagnosis not present

## 2021-02-03 DIAGNOSIS — N136 Pyonephrosis: Secondary | ICD-10-CM | POA: Diagnosis not present

## 2021-02-03 DIAGNOSIS — M47816 Spondylosis without myelopathy or radiculopathy, lumbar region: Secondary | ICD-10-CM | POA: Diagnosis not present

## 2021-02-03 DIAGNOSIS — R64 Cachexia: Secondary | ICD-10-CM | POA: Diagnosis not present

## 2021-02-03 DIAGNOSIS — B961 Klebsiella pneumoniae [K. pneumoniae] as the cause of diseases classified elsewhere: Secondary | ICD-10-CM | POA: Diagnosis not present

## 2021-02-03 DIAGNOSIS — G609 Hereditary and idiopathic neuropathy, unspecified: Secondary | ICD-10-CM | POA: Diagnosis not present

## 2021-02-03 DIAGNOSIS — R54 Age-related physical debility: Secondary | ICD-10-CM | POA: Diagnosis not present

## 2021-02-03 DIAGNOSIS — K922 Gastrointestinal hemorrhage, unspecified: Secondary | ICD-10-CM | POA: Diagnosis not present

## 2021-02-03 DIAGNOSIS — N10 Acute tubulo-interstitial nephritis: Secondary | ICD-10-CM | POA: Diagnosis not present

## 2021-02-03 DIAGNOSIS — E43 Unspecified severe protein-calorie malnutrition: Secondary | ICD-10-CM | POA: Diagnosis not present

## 2021-02-03 DIAGNOSIS — N132 Hydronephrosis with renal and ureteral calculous obstruction: Secondary | ICD-10-CM | POA: Diagnosis not present

## 2021-02-03 DIAGNOSIS — F17210 Nicotine dependence, cigarettes, uncomplicated: Secondary | ICD-10-CM | POA: Diagnosis not present

## 2021-02-03 DIAGNOSIS — Z96642 Presence of left artificial hip joint: Secondary | ICD-10-CM | POA: Diagnosis not present

## 2021-02-03 DIAGNOSIS — D500 Iron deficiency anemia secondary to blood loss (chronic): Secondary | ICD-10-CM | POA: Diagnosis not present

## 2021-02-03 DIAGNOSIS — E559 Vitamin D deficiency, unspecified: Secondary | ICD-10-CM | POA: Diagnosis not present

## 2021-02-04 ENCOUNTER — Encounter: Payer: Self-pay | Admitting: Urology

## 2021-02-04 ENCOUNTER — Other Ambulatory Visit: Payer: Self-pay

## 2021-02-04 ENCOUNTER — Ambulatory Visit (INDEPENDENT_AMBULATORY_CARE_PROVIDER_SITE_OTHER): Payer: Medicare HMO | Admitting: Urology

## 2021-02-04 VITALS — BP 106/62 | HR 103 | Wt 154.0 lb

## 2021-02-04 DIAGNOSIS — N401 Enlarged prostate with lower urinary tract symptoms: Secondary | ICD-10-CM

## 2021-02-04 DIAGNOSIS — R339 Retention of urine, unspecified: Secondary | ICD-10-CM | POA: Diagnosis not present

## 2021-02-04 DIAGNOSIS — N138 Other obstructive and reflux uropathy: Secondary | ICD-10-CM

## 2021-02-04 LAB — BLADDER SCAN AMB NON-IMAGING: Scan Result: 66

## 2021-02-04 MED ORDER — CEPHALEXIN 250 MG PO CAPS
500.0000 mg | ORAL_CAPSULE | Freq: Once | ORAL | Status: AC
  Administered 2021-02-04: 500 mg via ORAL

## 2021-02-04 NOTE — H&P (View-Only) (Signed)
02/04/21 9:47 AM   Mark Jimenez 10-27-1951 [id number]  CC: UTI, urinary retention, right-sided hydronephrosis  HPI: I saw [name] and his son today for the above issues.  He is a 70 year old comorbid and frail-appearing male who was hospitalized 1 month ago to [facility] with UTI and urinary retention with acute on chronic kidney injury(creatinine of 7.6 for an admission from baseline of ~2).  PSA was normal at 0.31.  A CT was performed showing a 55 g prostate with a catheter in the bladder that had recently been placed, and right-sided hydroureteronephrosis down to the bladder.  Bladder was incompletely evaluated secondary to hip prosthesis causing artifact.  He presented to the ED at that time because he was having overflow incontinence for about a week with leakage of urine and difficulty urinating.  He denies prior UTIs or urinary retention.  He was started on Flomax prior to discharge.  A renal ultrasound 3 days later showed resolution of the prior right-sided hydronephrosis, and the bladder was otherwise unremarkable.  He denies any history of gross hematuria, prior UTIs, or significant urinary symptoms or retention.   PMH: Past Medical History:  Diagnosis Date  . Chronic tophaceous gout   . Peripheral neuropathy     Surgical History: Past Surgical History:  Procedure Laterality Date  . 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 He has never used smokeless tobacco. He reports current alcohol use of about 2.0 standard drinks of alcohol per week. He reports previous drug use.  Physical Exam: BP 106/62   Pulse (!) 103   Wt 154 lb (69.9 kg)   BMI 20.89 kg/m    Constitutional:  Alert and oriented, No acute distress.  Frail appearing, in wheelchair Respiratory: Normal respiratory  effort, no increased work of breathing. GI: Abdomen is soft, nontender, nondistended, no abdominal masses Foley with yellow urine  Laboratory Data: Reviewed, see HPI  Pertinent Imaging: I have personally viewed and interpreted the CT and renal ultrasound from [facility] in the [facility] system, see HPI for details  Assessment & Plan:   Comorbid 70 year old male who developed acute urinary retention and UTI 1 month ago and was hospitalized at [facility] and a Foley placed.  He had right-sided hydroureteronephrosis down to the bladder at that time likely in the setting of infection and retention.  Right-sided hydro resolved after catheter placement.  There is no definite bladder mass on CT or renal ultrasound, and he has no history of gross hematuria.  He was given Keflex prophylaxis today and Foley catheter was removed.  He returned to clinic this afternoon with a PVR of 650 mL and has been unable to void.  He has lower abdominal pressure and discomfort.  A Foley catheter was replaced, see RN note for details.  We discussed the risks and benefits of HoLEP at length.  The procedure requires general anesthesia and takes 2 to 3 hours, and a holmium laser is used to enucleate the prostate and push this tissue into the bladder.  A morcellator is then used to remove this tissue, which is sent for pathology.  The vast majority of patients are able to discharge the same day with a catheter in place for 2 to 3 days, and will follow-up in clinic for a voiding trial.  Approximately 5% of patients will  be admitted overnight to monitor the urine, or if they have multiple co-morbidities.  We specifically discussed the risks of bleeding, infection, retrograde ejaculation, temporary urgency and urge incontinence, very low risk of long-term incontinence, pathologic evaluation of prostate tissue and possible detection of prostate cancer or other malignancy, and possible need for additional procedures.  Continue Flomax RTC 2 weeks  for void trial and PVR If fails voiding trial in 2 weeks, likely pursue HOLEP   I spent 60 total minutes on the day of the encounter including pre-visit review of the medical record, face-to-face time with the patient, and post visit ordering of labs/imaging/tests.  Nickolas Madrid, MD 02/04/2021  [facility] [address] [place], [place] [address] [phone]

## 2021-02-04 NOTE — Patient Instructions (Signed)

## 2021-02-04 NOTE — Progress Notes (Signed)
02/04/21 9:47 AM   Mark Jimenez [birth year] [id number]  CC: UTI, urinary retention, right-sided hydronephrosis  HPI: I saw Mark Jimenez and his son today for the above issues.  He is a 70 year old comorbid and frail-appearing male who was hospitalized 1 month ago to [facility] with UTI and urinary retention with acute on chronic kidney injury(creatinine of 7.6 for an admission from baseline of ~2).  PSA was normal at 0.31.  A CT was performed showing a 55 g prostate with a catheter in the bladder that had recently been placed, and right-sided hydroureteronephrosis down to the bladder.  Bladder was incompletely evaluated secondary to hip prosthesis causing artifact.  He presented to the ED at that time because he was having overflow incontinence for about a week with leakage of urine and difficulty urinating.  He denies prior UTIs or urinary retention.  He was started on Flomax prior to discharge.  A renal ultrasound 3 days later showed resolution of the prior right-sided hydronephrosis, and the bladder was otherwise unremarkable.  He denies any history of gross hematuria, prior UTIs, or significant urinary symptoms or retention.   PMH: Past Medical History:  Diagnosis Date  . Chronic tophaceous gout   . Peripheral neuropathy     Surgical History: Past Surgical History:  Procedure Laterality Date  . 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 He has never used smokeless tobacco. He reports current alcohol use of about 2.0 standard drinks of alcohol per week. He reports previous drug use.  Physical Exam: BP 106/62   Pulse (!) 103   Wt 154 lb (69.9 kg)   BMI 20.89 kg/m    Constitutional:  Alert and oriented, No acute distress.  Frail appearing, in wheelchair Respiratory: Normal respiratory  effort, no increased work of breathing. GI: Abdomen is soft, nontender, nondistended, no abdominal masses Foley with yellow urine  Laboratory Data: Reviewed, see HPI  Pertinent Imaging: I have personally viewed and interpreted the CT and renal ultrasound from [facility] in the [facility] system, see HPI for details  Assessment & Plan:   Comorbid 70 year old male who developed acute urinary retention and UTI 1 month ago and was hospitalized at [facility] and a Foley placed.  He had right-sided hydroureteronephrosis down to the bladder at that time likely in the setting of infection and retention.  Right-sided hydro resolved after catheter placement.  There is no definite bladder mass on CT or renal ultrasound, and he has no history of gross hematuria.  He was given Keflex prophylaxis today and Foley catheter was removed.  He returned to clinic this afternoon with a PVR of 650 mL and has been unable to void.  He has lower abdominal pressure and discomfort.  A Foley catheter was replaced, see RN note for details.  We discussed the risks and benefits of HoLEP at length.  The procedure requires general anesthesia and takes 2 to 3 hours, and a holmium laser is used to enucleate the prostate and push this tissue into the bladder.  A morcellator is then used to remove this tissue, which is sent for pathology.  The vast majority of patients are able to discharge the same day with a catheter in place for 2 to 3 days, and will follow-up in clinic for a voiding trial.  Approximately 5% of patients will  be admitted overnight to monitor the urine, or if they have multiple co-morbidities.  We specifically discussed the risks of bleeding, infection, retrograde ejaculation, temporary urgency and urge incontinence, very low risk of long-term incontinence, pathologic evaluation of prostate tissue and possible detection of prostate cancer or other malignancy, and possible need for additional procedures.  Continue Flomax RTC 2 weeks  for void trial and PVR If fails voiding trial in 2 weeks, likely pursue HOLEP   I spent 60 total minutes on the day of the encounter including pre-visit review of the medical record, face-to-face time with the patient, and post visit ordering of labs/imaging/tests.  Nickolas Madrid, MD 02/04/2021  [facility] [address] [place], [place] [address] [phone]

## 2021-02-04 NOTE — Progress Notes (Signed)
Patient ID: Mark Jimenez, male   DOB: 19-Apr-1951, 70 y.o.   MRN: [id number] Fill and Pull Catheter Removal  Patient is present today for a catheter removal.  Patient was cleaned and prepped in a sterile fashion 287m of sterile water/ saline was instilled into the bladder when the patient felt the urge to urinate. 836mof water was then drained from the balloon.  A 16FR foley cath was removed from the bladder no complications were noted .  Patient as then given some time to void on their own.  Patient cannot void on their own after some time.  Patient tolerated well.  Performed by: DeEdwin DadaCMA  Follow up/ Additional notes: follow-up this afternoon at 2pm   Simple Catheter Placement  Due to urinary retention patient is present today for a foley cath placement.  Patient was cleaned and prepped in a sterile fashion with betadine. A 18 FR coude foley catheter was inserted, urine return was noted  30081murine was yellow in color.  The balloon was filled with 10cc of sterile water.  A night bag was attached for drainage. Patient was also given a night bag to take home and was given instruction on how to change from one bag to another.  Patient was given instruction on proper catheter care.  Patient tolerated well, no complications were noted   Performed by: DeSEdwin DadaMA  Additional notes/ Follow up: 2 week voiding trial

## 2021-02-05 DIAGNOSIS — N136 Pyonephrosis: Secondary | ICD-10-CM | POA: Diagnosis not present

## 2021-02-05 DIAGNOSIS — E43 Unspecified severe protein-calorie malnutrition: Secondary | ICD-10-CM | POA: Diagnosis not present

## 2021-02-05 DIAGNOSIS — M1611 Unilateral primary osteoarthritis, right hip: Secondary | ICD-10-CM | POA: Diagnosis not present

## 2021-02-05 DIAGNOSIS — L89612 Pressure ulcer of right heel, stage 2: Secondary | ICD-10-CM | POA: Diagnosis not present

## 2021-02-05 DIAGNOSIS — B961 Klebsiella pneumoniae [K. pneumoniae] as the cause of diseases classified elsewhere: Secondary | ICD-10-CM | POA: Diagnosis not present

## 2021-02-05 DIAGNOSIS — D649 Anemia, unspecified: Secondary | ICD-10-CM | POA: Diagnosis not present

## 2021-02-05 DIAGNOSIS — M47814 Spondylosis without myelopathy or radiculopathy, thoracic region: Secondary | ICD-10-CM | POA: Diagnosis not present

## 2021-02-05 DIAGNOSIS — M47816 Spondylosis without myelopathy or radiculopathy, lumbar region: Secondary | ICD-10-CM | POA: Diagnosis not present

## 2021-02-05 DIAGNOSIS — R54 Age-related physical debility: Secondary | ICD-10-CM | POA: Diagnosis not present

## 2021-02-05 DIAGNOSIS — L89622 Pressure ulcer of left heel, stage 2: Secondary | ICD-10-CM | POA: Diagnosis not present

## 2021-02-05 DIAGNOSIS — N179 Acute kidney failure, unspecified: Secondary | ICD-10-CM | POA: Diagnosis not present

## 2021-02-05 DIAGNOSIS — S72032S Displaced midcervical fracture of left femur, sequela: Secondary | ICD-10-CM | POA: Diagnosis not present

## 2021-02-06 DIAGNOSIS — I9589 Other hypotension: Secondary | ICD-10-CM | POA: Diagnosis not present

## 2021-02-06 DIAGNOSIS — N179 Acute kidney failure, unspecified: Secondary | ICD-10-CM | POA: Diagnosis not present

## 2021-02-06 DIAGNOSIS — R627 Adult failure to thrive: Secondary | ICD-10-CM | POA: Diagnosis not present

## 2021-02-06 DIAGNOSIS — M1A30X0 Chronic gout due to renal impairment, unspecified site, without tophus (tophi): Secondary | ICD-10-CM | POA: Diagnosis not present

## 2021-02-06 DIAGNOSIS — G609 Hereditary and idiopathic neuropathy, unspecified: Secondary | ICD-10-CM | POA: Diagnosis not present

## 2021-02-06 DIAGNOSIS — L719 Rosacea, unspecified: Secondary | ICD-10-CM | POA: Diagnosis not present

## 2021-02-06 DIAGNOSIS — D631 Anemia in chronic kidney disease: Secondary | ICD-10-CM | POA: Diagnosis not present

## 2021-02-06 DIAGNOSIS — E872 Acidosis: Secondary | ICD-10-CM | POA: Diagnosis not present

## 2021-02-06 DIAGNOSIS — K8020 Calculus of gallbladder without cholecystitis without obstruction: Secondary | ICD-10-CM | POA: Diagnosis not present

## 2021-02-06 DIAGNOSIS — K219 Gastro-esophageal reflux disease without esophagitis: Secondary | ICD-10-CM | POA: Diagnosis not present

## 2021-02-06 DIAGNOSIS — E43 Unspecified severe protein-calorie malnutrition: Secondary | ICD-10-CM | POA: Diagnosis not present

## 2021-02-06 DIAGNOSIS — R64 Cachexia: Secondary | ICD-10-CM | POA: Diagnosis not present

## 2021-02-06 DIAGNOSIS — E039 Hypothyroidism, unspecified: Secondary | ICD-10-CM | POA: Diagnosis not present

## 2021-02-06 DIAGNOSIS — R339 Retention of urine, unspecified: Secondary | ICD-10-CM | POA: Diagnosis not present

## 2021-02-06 DIAGNOSIS — R69 Illness, unspecified: Secondary | ICD-10-CM | POA: Diagnosis not present

## 2021-02-06 DIAGNOSIS — K922 Gastrointestinal hemorrhage, unspecified: Secondary | ICD-10-CM | POA: Diagnosis not present

## 2021-02-06 DIAGNOSIS — D500 Iron deficiency anemia secondary to blood loss (chronic): Secondary | ICD-10-CM | POA: Diagnosis not present

## 2021-02-06 DIAGNOSIS — N184 Chronic kidney disease, stage 4 (severe): Secondary | ICD-10-CM | POA: Diagnosis not present

## 2021-02-06 DIAGNOSIS — F5105 Insomnia due to other mental disorder: Secondary | ICD-10-CM | POA: Diagnosis not present

## 2021-02-06 DIAGNOSIS — E559 Vitamin D deficiency, unspecified: Secondary | ICD-10-CM | POA: Diagnosis not present

## 2021-02-06 DIAGNOSIS — N132 Hydronephrosis with renal and ureteral calculous obstruction: Secondary | ICD-10-CM | POA: Diagnosis not present

## 2021-02-06 DIAGNOSIS — L89152 Pressure ulcer of sacral region, stage 2: Secondary | ICD-10-CM | POA: Diagnosis not present

## 2021-02-06 DIAGNOSIS — N10 Acute tubulo-interstitial nephritis: Secondary | ICD-10-CM | POA: Diagnosis not present

## 2021-02-10 DIAGNOSIS — R339 Retention of urine, unspecified: Secondary | ICD-10-CM | POA: Diagnosis not present

## 2021-02-10 DIAGNOSIS — K922 Gastrointestinal hemorrhage, unspecified: Secondary | ICD-10-CM | POA: Diagnosis not present

## 2021-02-10 DIAGNOSIS — L719 Rosacea, unspecified: Secondary | ICD-10-CM | POA: Diagnosis not present

## 2021-02-10 DIAGNOSIS — E872 Acidosis: Secondary | ICD-10-CM | POA: Diagnosis not present

## 2021-02-10 DIAGNOSIS — D631 Anemia in chronic kidney disease: Secondary | ICD-10-CM | POA: Diagnosis not present

## 2021-02-10 DIAGNOSIS — K8020 Calculus of gallbladder without cholecystitis without obstruction: Secondary | ICD-10-CM | POA: Diagnosis not present

## 2021-02-10 DIAGNOSIS — G609 Hereditary and idiopathic neuropathy, unspecified: Secondary | ICD-10-CM | POA: Diagnosis not present

## 2021-02-10 DIAGNOSIS — D500 Iron deficiency anemia secondary to blood loss (chronic): Secondary | ICD-10-CM | POA: Diagnosis not present

## 2021-02-10 DIAGNOSIS — L89152 Pressure ulcer of sacral region, stage 2: Secondary | ICD-10-CM | POA: Diagnosis not present

## 2021-02-10 DIAGNOSIS — I9589 Other hypotension: Secondary | ICD-10-CM | POA: Diagnosis not present

## 2021-02-10 DIAGNOSIS — R627 Adult failure to thrive: Secondary | ICD-10-CM | POA: Diagnosis not present

## 2021-02-10 DIAGNOSIS — E43 Unspecified severe protein-calorie malnutrition: Secondary | ICD-10-CM | POA: Diagnosis not present

## 2021-02-10 DIAGNOSIS — R64 Cachexia: Secondary | ICD-10-CM | POA: Diagnosis not present

## 2021-02-10 DIAGNOSIS — N184 Chronic kidney disease, stage 4 (severe): Secondary | ICD-10-CM | POA: Diagnosis not present

## 2021-02-10 DIAGNOSIS — N132 Hydronephrosis with renal and ureteral calculous obstruction: Secondary | ICD-10-CM | POA: Diagnosis not present

## 2021-02-10 DIAGNOSIS — R69 Illness, unspecified: Secondary | ICD-10-CM | POA: Diagnosis not present

## 2021-02-10 DIAGNOSIS — E039 Hypothyroidism, unspecified: Secondary | ICD-10-CM | POA: Diagnosis not present

## 2021-02-10 DIAGNOSIS — K219 Gastro-esophageal reflux disease without esophagitis: Secondary | ICD-10-CM | POA: Diagnosis not present

## 2021-02-10 DIAGNOSIS — E559 Vitamin D deficiency, unspecified: Secondary | ICD-10-CM | POA: Diagnosis not present

## 2021-02-10 DIAGNOSIS — N10 Acute tubulo-interstitial nephritis: Secondary | ICD-10-CM | POA: Diagnosis not present

## 2021-02-10 DIAGNOSIS — M1A30X0 Chronic gout due to renal impairment, unspecified site, without tophus (tophi): Secondary | ICD-10-CM | POA: Diagnosis not present

## 2021-02-10 DIAGNOSIS — N179 Acute kidney failure, unspecified: Secondary | ICD-10-CM | POA: Diagnosis not present

## 2021-02-10 DIAGNOSIS — F5105 Insomnia due to other mental disorder: Secondary | ICD-10-CM | POA: Diagnosis not present

## 2021-02-11 DIAGNOSIS — H5213 Myopia, bilateral: Secondary | ICD-10-CM | POA: Diagnosis not present

## 2021-02-11 DIAGNOSIS — Z0100 Encounter for examination of eyes and vision without abnormal findings: Secondary | ICD-10-CM | POA: Diagnosis not present

## 2021-02-12 DIAGNOSIS — K922 Gastrointestinal hemorrhage, unspecified: Secondary | ICD-10-CM | POA: Diagnosis not present

## 2021-02-12 DIAGNOSIS — D631 Anemia in chronic kidney disease: Secondary | ICD-10-CM | POA: Diagnosis not present

## 2021-02-12 DIAGNOSIS — N179 Acute kidney failure, unspecified: Secondary | ICD-10-CM | POA: Diagnosis not present

## 2021-02-12 DIAGNOSIS — F5105 Insomnia due to other mental disorder: Secondary | ICD-10-CM | POA: Diagnosis not present

## 2021-02-12 DIAGNOSIS — E872 Acidosis: Secondary | ICD-10-CM | POA: Diagnosis not present

## 2021-02-12 DIAGNOSIS — L89152 Pressure ulcer of sacral region, stage 2: Secondary | ICD-10-CM | POA: Diagnosis not present

## 2021-02-12 DIAGNOSIS — R69 Illness, unspecified: Secondary | ICD-10-CM | POA: Diagnosis not present

## 2021-02-12 DIAGNOSIS — N132 Hydronephrosis with renal and ureteral calculous obstruction: Secondary | ICD-10-CM | POA: Diagnosis not present

## 2021-02-12 DIAGNOSIS — E43 Unspecified severe protein-calorie malnutrition: Secondary | ICD-10-CM | POA: Diagnosis not present

## 2021-02-12 DIAGNOSIS — L719 Rosacea, unspecified: Secondary | ICD-10-CM | POA: Diagnosis not present

## 2021-02-12 DIAGNOSIS — R339 Retention of urine, unspecified: Secondary | ICD-10-CM | POA: Diagnosis not present

## 2021-02-12 DIAGNOSIS — R64 Cachexia: Secondary | ICD-10-CM | POA: Diagnosis not present

## 2021-02-12 DIAGNOSIS — E559 Vitamin D deficiency, unspecified: Secondary | ICD-10-CM | POA: Diagnosis not present

## 2021-02-12 DIAGNOSIS — M1A30X0 Chronic gout due to renal impairment, unspecified site, without tophus (tophi): Secondary | ICD-10-CM | POA: Diagnosis not present

## 2021-02-12 DIAGNOSIS — K8020 Calculus of gallbladder without cholecystitis without obstruction: Secondary | ICD-10-CM | POA: Diagnosis not present

## 2021-02-12 DIAGNOSIS — N10 Acute tubulo-interstitial nephritis: Secondary | ICD-10-CM | POA: Diagnosis not present

## 2021-02-12 DIAGNOSIS — N184 Chronic kidney disease, stage 4 (severe): Secondary | ICD-10-CM | POA: Diagnosis not present

## 2021-02-12 DIAGNOSIS — K219 Gastro-esophageal reflux disease without esophagitis: Secondary | ICD-10-CM | POA: Diagnosis not present

## 2021-02-12 DIAGNOSIS — E039 Hypothyroidism, unspecified: Secondary | ICD-10-CM | POA: Diagnosis not present

## 2021-02-12 DIAGNOSIS — D500 Iron deficiency anemia secondary to blood loss (chronic): Secondary | ICD-10-CM | POA: Diagnosis not present

## 2021-02-12 DIAGNOSIS — G609 Hereditary and idiopathic neuropathy, unspecified: Secondary | ICD-10-CM | POA: Diagnosis not present

## 2021-02-12 DIAGNOSIS — I9589 Other hypotension: Secondary | ICD-10-CM | POA: Diagnosis not present

## 2021-02-12 DIAGNOSIS — R627 Adult failure to thrive: Secondary | ICD-10-CM | POA: Diagnosis not present

## 2021-02-13 DIAGNOSIS — E43 Unspecified severe protein-calorie malnutrition: Secondary | ICD-10-CM | POA: Diagnosis not present

## 2021-02-13 DIAGNOSIS — R339 Retention of urine, unspecified: Secondary | ICD-10-CM | POA: Diagnosis not present

## 2021-02-18 ENCOUNTER — Encounter: Payer: Self-pay | Admitting: Urology

## 2021-02-18 ENCOUNTER — Ambulatory Visit: Payer: Medicare HMO | Admitting: Urology

## 2021-02-18 ENCOUNTER — Other Ambulatory Visit: Payer: Self-pay

## 2021-02-18 VITALS — BP 114/58 | HR 73 | Ht 72.0 in | Wt 163.0 lb

## 2021-02-18 DIAGNOSIS — N138 Other obstructive and reflux uropathy: Secondary | ICD-10-CM

## 2021-02-18 DIAGNOSIS — N401 Enlarged prostate with lower urinary tract symptoms: Secondary | ICD-10-CM

## 2021-02-18 DIAGNOSIS — R339 Retention of urine, unspecified: Secondary | ICD-10-CM | POA: Diagnosis not present

## 2021-02-18 LAB — BLADDER SCAN AMB NON-IMAGING

## 2021-02-18 MED ORDER — CEPHALEXIN 250 MG PO CAPS
500.0000 mg | ORAL_CAPSULE | Freq: Once | ORAL | Status: AC
  Administered 2021-02-18: 500 mg via ORAL

## 2021-02-18 MED ORDER — SULFAMETHOXAZOLE-TRIMETHOPRIM 800-160 MG PO TABS: 1.0000 | ORAL_TABLET | Freq: Every day | ORAL | 0 refills | Status: DC

## 2021-02-18 MED ORDER — NYSTATIN 100000 UNIT/GM EX POWD: 1.0000 "application " | Freq: Three times a day (TID) | CUTANEOUS | 0 refills | Status: DC

## 2021-02-18 NOTE — Progress Notes (Signed)
Patient ID: Mark Jimenez, male   DOB: 21-Oct-1951, 70 y.o.   MRN: [id number] Catheter Removal  Patient is present today for a catheter removal.  28m of water was drained from the balloon. A 18FR foley cath was removed from the bladder no complications were noted . Patient tolerated well.  Performed by: DEdwin Dada CMA  Follow up/ Additional notes: follow-up this afternoon with PVR

## 2021-02-18 NOTE — Patient Instructions (Signed)

## 2021-02-18 NOTE — Progress Notes (Signed)
   02/18/2021 2:14 PM   Kristen Loader 04-21-1951 [id number]  Reason for visit: Follow up BPH and urinary retention  HPI: He is a 70 year old comorbid and frail-appearing male who was hospitalized 6 weeks ago at [facility] with UTI and urinary retention with acute on chronic kidney injury(creatinine of 7.6 for an admission from baseline of ~2).  PSA was normal at 0.31.  A CT was performed showing a 55 g prostate with a catheter in the bladder that had recently been placed, and right-sided hydroureteronephrosis down to the bladder.  Bladder was incompletely evaluated secondary to hip prosthesis causing artifact.  He presented to the ED at that time because he was having overflow incontinence for about a week with leakage of urine and difficulty urinating.  He denies prior UTIs or urinary retention.  He was started on Flomax prior to discharge.  A renal ultrasound 3 days later showed resolution of the prior right-sided hydronephrosis, and the bladder was otherwise unremarkable.  He failed a voiding trial in clinic 2 weeks ago with inability to urinate and PVR 665m, with plan to return today for repeat voiding trial.  Today, foley removed and he has been able to void only a few cc and PVR this afternoon remains significantly elevated at 5563mwith lower abdominal discomfort.  His Foley was replaced today, see note for details.  We discussed options at length including chronic Foley with monthly changes, intermittent catheterization, or an outlet procedure. We discussed the risks and benefits of HoLEP at length.  The procedure requires general anesthesia and takes 2 to 3 hours, and a holmium laser is used to enucleate the prostate and push this tissue into the bladder.  A morcellator is then used to remove this tissue, which is sent for pathology.  The vast majority of patients are able to discharge the same day with a catheter in place for 2 to 3 days, and will follow-up in clinic for a voiding trial.   Approximately 5% of patients will be admitted overnight to monitor the urine, or if they have multiple co-morbidities.  We specifically discussed the risks of bleeding, infection, retrograde ejaculation, temporary urgency and urge incontinence, very low risk of long-term incontinence, pathologic evaluation of prostate tissue and possible detection of prostate cancer or other malignancy, and possible need for additional procedures.  He opts for outlet procedure with HOLEP, will schedule.    BrBilley CoMD[facility]lewoodrological [facility] [address] [phone]

## 2021-02-19 ENCOUNTER — Other Ambulatory Visit: Admission: RE | Admit: 2021-02-19 | Payer: Medicare HMO | Source: Ambulatory Visit

## 2021-02-19 ENCOUNTER — Ambulatory Visit: Payer: Self-pay | Admitting: Physician Assistant

## 2021-02-19 ENCOUNTER — Other Ambulatory Visit: Payer: Self-pay | Admitting: Urology

## 2021-02-19 ENCOUNTER — Ambulatory Visit: Payer: Self-pay | Admitting: Urology

## 2021-02-19 DIAGNOSIS — R69 Illness, unspecified: Secondary | ICD-10-CM | POA: Diagnosis not present

## 2021-02-19 DIAGNOSIS — I9589 Other hypotension: Secondary | ICD-10-CM | POA: Diagnosis not present

## 2021-02-19 DIAGNOSIS — L89152 Pressure ulcer of sacral region, stage 2: Secondary | ICD-10-CM | POA: Diagnosis not present

## 2021-02-19 DIAGNOSIS — R64 Cachexia: Secondary | ICD-10-CM | POA: Diagnosis not present

## 2021-02-19 DIAGNOSIS — N132 Hydronephrosis with renal and ureteral calculous obstruction: Secondary | ICD-10-CM | POA: Diagnosis not present

## 2021-02-19 DIAGNOSIS — R339 Retention of urine, unspecified: Secondary | ICD-10-CM | POA: Diagnosis not present

## 2021-02-19 DIAGNOSIS — M1A30X0 Chronic gout due to renal impairment, unspecified site, without tophus (tophi): Secondary | ICD-10-CM | POA: Diagnosis not present

## 2021-02-19 DIAGNOSIS — K8020 Calculus of gallbladder without cholecystitis without obstruction: Secondary | ICD-10-CM | POA: Diagnosis not present

## 2021-02-19 DIAGNOSIS — E43 Unspecified severe protein-calorie malnutrition: Secondary | ICD-10-CM | POA: Diagnosis not present

## 2021-02-19 DIAGNOSIS — K219 Gastro-esophageal reflux disease without esophagitis: Secondary | ICD-10-CM | POA: Diagnosis not present

## 2021-02-19 DIAGNOSIS — F5105 Insomnia due to other mental disorder: Secondary | ICD-10-CM | POA: Diagnosis not present

## 2021-02-19 DIAGNOSIS — N179 Acute kidney failure, unspecified: Secondary | ICD-10-CM | POA: Diagnosis not present

## 2021-02-19 DIAGNOSIS — L719 Rosacea, unspecified: Secondary | ICD-10-CM | POA: Diagnosis not present

## 2021-02-19 DIAGNOSIS — G609 Hereditary and idiopathic neuropathy, unspecified: Secondary | ICD-10-CM | POA: Diagnosis not present

## 2021-02-19 DIAGNOSIS — N10 Acute tubulo-interstitial nephritis: Secondary | ICD-10-CM | POA: Diagnosis not present

## 2021-02-19 DIAGNOSIS — E872 Acidosis: Secondary | ICD-10-CM | POA: Diagnosis not present

## 2021-02-19 DIAGNOSIS — D500 Iron deficiency anemia secondary to blood loss (chronic): Secondary | ICD-10-CM | POA: Diagnosis not present

## 2021-02-19 DIAGNOSIS — E039 Hypothyroidism, unspecified: Secondary | ICD-10-CM | POA: Diagnosis not present

## 2021-02-19 DIAGNOSIS — E559 Vitamin D deficiency, unspecified: Secondary | ICD-10-CM | POA: Diagnosis not present

## 2021-02-19 DIAGNOSIS — R627 Adult failure to thrive: Secondary | ICD-10-CM | POA: Diagnosis not present

## 2021-02-19 DIAGNOSIS — N138 Other obstructive and reflux uropathy: Secondary | ICD-10-CM

## 2021-02-19 DIAGNOSIS — K922 Gastrointestinal hemorrhage, unspecified: Secondary | ICD-10-CM | POA: Diagnosis not present

## 2021-02-19 DIAGNOSIS — D631 Anemia in chronic kidney disease: Secondary | ICD-10-CM | POA: Diagnosis not present

## 2021-02-19 DIAGNOSIS — N184 Chronic kidney disease, stage 4 (severe): Secondary | ICD-10-CM | POA: Diagnosis not present

## 2021-02-20 ENCOUNTER — Other Ambulatory Visit
Admission: RE | Admit: 2021-02-20 | Discharge: 2021-02-20 | Disposition: A | Discharge status: Home / Self Care | Payer: Medicare HMO | Source: Ambulatory Visit | Attending: Urology | Admitting: Urology

## 2021-02-20 ENCOUNTER — Encounter
Admission: RE | Admit: 2021-02-20 | Discharge: 2021-02-20 | Disposition: A | Discharge status: Home / Self Care | Payer: Medicare HMO | Source: Ambulatory Visit | Attending: Urology | Admitting: Urology

## 2021-02-20 ENCOUNTER — Other Ambulatory Visit: Payer: Self-pay

## 2021-02-20 DIAGNOSIS — Z20822 Contact with and (suspected) exposure to covid-19: Secondary | ICD-10-CM | POA: Diagnosis not present

## 2021-02-20 DIAGNOSIS — Z01818 Encounter for other preprocedural examination: Secondary | ICD-10-CM | POA: Diagnosis not present

## 2021-02-20 DIAGNOSIS — Z01810 Encounter for preprocedural cardiovascular examination: Secondary | ICD-10-CM | POA: Diagnosis not present

## 2021-02-20 HISTORY — DX: Personal history of urinary calculi: Z87.442

## 2021-02-20 HISTORY — DX: Gout, unspecified: M10.9

## 2021-02-20 LAB — SARS CORONAVIRUS 2 (TAT 6-24 HRS): SARS Coronavirus 2: NEGATIVE

## 2021-02-20 NOTE — Patient Instructions (Signed)
Your procedure is scheduled on: Friday 02/21/21.  Report to THE FIRST FLOOR REGISTRATION DESK IN THE MEDICAL MALL ON THE MORNING OF SURGERY FIRST, THEN YOU WILL CHECK IN AT THE SURGERY INFORMATION DESK LOCATED OUTSIDE THE SAME DAY SURGERY DEPARTMENT LOCATED ON 2ND FLOOR MEDICAL MALL ENTRANCE.  To find out your arrival time please call [phone] between 1PM - 3PM on  Thursday 02/20/21.   Remember: Instructions that are not followed completely may result in serious medical risk, up to and including death, or upon the discretion of your surgeon and anesthesiologist your surgery may need to be rescheduled.     __X__ 1. Do not eat food or drink liquids after midnight the night before your procedure.                  __X__2.  On the morning of surgery brush your teeth with toothpaste and water, you may rinse your mouth with mouthwash if you wish.  Do not swallow any toothpaste or mouthwash.    __X__ 3.  No Alcohol for 24 hours before or after surgery.  __X__ 4.  Do Not Smoke or use e-cigarettes For 24 Hours Prior to Your Surgery.                 Do not use any chewable tobacco products for at least 6 hours prior to                 surgery.  __X__5.  Notify your doctor if there is any change in your medical condition      (cold, fever, infections).      Do NOT wear jewelry, make-up, hairpins, clips or nail polish. Do NOT wear lotions, powders, or perfumes.  Do NOT shave 48 hours prior to surgery. Men may shave face and neck. Do NOT bring valuables to the hospital.     [facility] is not responsible for any belongings or valuables.   Contacts, dentures/partials or body piercings may not be worn into surgery. Bring a case for your contacts, glasses or hearing aids, a denture cup will be supplied.    Patients discharged the day of surgery will not be allowed to drive home.     __X__ Take these medicines the morning of surgery with A SIP OF WATER:     1. Omeprazole    __X__ Stop  Anti-inflammatories 7 days before surgery such as Advil, Ibuprofen, Motrin, BC or Goodies Powder, Naprosyn, Naproxen, Aleve, Aspirin, Meloxicam. May take Tylenol if needed for pain or discomfort.   __X__Do not start taking any new herbal supplements or vitamins prior to your procedure.    __X__ Stop the following herbal supplements or vitamins:  ascorbic acid   Wear comfortable clothing (specific to your surgery type) to the hospital.  Plan for stool softeners for home use; pain medications have a tendency to cause constipation. You can also help prevent constipation by eating foods high in fiber such as fruits and vegetables and drinking plenty of fluids as your diet allows.  After surgery, you can prevent lung complications by doing breathing exercises.Take deep breaths and cough every 1-2 hours. Your doctor may order a device called an Incentive Spirometer to help you take deep breaths.  Please call the [place] Department at [phone] if you have any questions about these instructions

## 2021-02-21 ENCOUNTER — Encounter: Payer: Self-pay | Admitting: Urology

## 2021-02-21 ENCOUNTER — Encounter
Admission: RE | Disposition: A | Discharge status: Home / Self Care | Payer: Self-pay | Source: Home / Self Care | Attending: Urology

## 2021-02-21 ENCOUNTER — Ambulatory Visit: Payer: Medicare HMO | Admitting: Anesthesiology

## 2021-02-21 ENCOUNTER — Ambulatory Visit
Admission: RE | Admit: 2021-02-21 | Discharge: 2021-02-21 | Disposition: A | Discharge status: Home / Self Care | Payer: Medicare HMO | Attending: Urology | Admitting: Urology

## 2021-02-21 DIAGNOSIS — Z87891 Personal history of nicotine dependence: Secondary | ICD-10-CM | POA: Diagnosis not present

## 2021-02-21 DIAGNOSIS — N138 Other obstructive and reflux uropathy: Secondary | ICD-10-CM | POA: Insufficient documentation

## 2021-02-21 DIAGNOSIS — R338 Other retention of urine: Secondary | ICD-10-CM | POA: Insufficient documentation

## 2021-02-21 DIAGNOSIS — N403 Nodular prostate with lower urinary tract symptoms: Secondary | ICD-10-CM | POA: Diagnosis not present

## 2021-02-21 DIAGNOSIS — Z87440 Personal history of urinary (tract) infections: Secondary | ICD-10-CM | POA: Diagnosis not present

## 2021-02-21 DIAGNOSIS — R69 Illness, unspecified: Secondary | ICD-10-CM | POA: Diagnosis not present

## 2021-02-21 DIAGNOSIS — R339 Retention of urine, unspecified: Secondary | ICD-10-CM

## 2021-02-21 DIAGNOSIS — G609 Hereditary and idiopathic neuropathy, unspecified: Secondary | ICD-10-CM | POA: Diagnosis not present

## 2021-02-21 DIAGNOSIS — N401 Enlarged prostate with lower urinary tract symptoms: Secondary | ICD-10-CM | POA: Insufficient documentation

## 2021-02-21 HISTORY — PX: HOLEP-LASER ENUCLEATION OF THE PROSTATE WITH MORCELLATION: SHX6641

## 2021-02-21 SURGERY — ENUCLEATION, PROSTATE, USING LASER, WITH MORCELLATION
Anesthesia: General

## 2021-02-21 MED ORDER — LACTATED RINGERS IV SOLN: INTRAVENOUS | Status: DC

## 2021-02-21 MED ORDER — ACETAMINOPHEN 10 MG/ML IV SOLN
INTRAVENOUS | Status: DC | PRN
  Administered 2021-02-21: 1000 mg via INTRAVENOUS

## 2021-02-21 MED ORDER — MEPERIDINE HCL 50 MG/ML IJ SOLN: 6.2500 mg | INTRAMUSCULAR | Status: DC | PRN

## 2021-02-21 MED ORDER — FENTANYL CITRATE (PF) 100 MCG/2ML IJ SOLN: 25.0000 ug | INTRAMUSCULAR | Status: DC | PRN

## 2021-02-21 MED ORDER — ACETAMINOPHEN 10 MG/ML IV SOLN
INTRAVENOUS | Status: AC
  Filled 2021-02-21: qty 100

## 2021-02-21 MED ORDER — CEFAZOLIN SODIUM-DEXTROSE 2-4 GM/100ML-% IV SOLN
2.0000 g | INTRAVENOUS | Status: AC
  Administered 2021-02-21: 2 g via INTRAVENOUS

## 2021-02-21 MED ORDER — ORAL CARE MOUTH RINSE: 15.0000 mL | Freq: Once | OROMUCOSAL | Status: AC

## 2021-02-21 MED ORDER — BELLADONNA ALKALOIDS-OPIUM 16.2-60 MG RE SUPP
RECTAL | Status: DC | PRN
  Administered 2021-02-21: 1 via RECTAL

## 2021-02-21 MED ORDER — CEFAZOLIN SODIUM-DEXTROSE 2-4 GM/100ML-% IV SOLN
INTRAVENOUS | Status: AC
  Filled 2021-02-21: qty 100

## 2021-02-21 MED ORDER — OXYCODONE HCL 5 MG/5ML PO SOLN: 5.0000 mg | Freq: Once | ORAL | Status: DC | PRN

## 2021-02-21 MED ORDER — HYDROCODONE-ACETAMINOPHEN 5-325 MG PO TABS: 1.0000 | ORAL_TABLET | Freq: Four times a day (QID) | ORAL | 0 refills | Status: AC | PRN

## 2021-02-21 MED ORDER — FENTANYL CITRATE (PF) 100 MCG/2ML IJ SOLN
INTRAMUSCULAR | Status: AC
  Filled 2021-02-21: qty 2

## 2021-02-21 MED ORDER — LIDOCAINE HCL (CARDIAC) PF 100 MG/5ML IV SOSY
PREFILLED_SYRINGE | INTRAVENOUS | Status: DC | PRN
  Administered 2021-02-21: 100 mg via INTRAVENOUS

## 2021-02-21 MED ORDER — ONDANSETRON HCL 4 MG/2ML IJ SOLN
INTRAMUSCULAR | Status: DC | PRN
  Administered 2021-02-21: 4 mg via INTRAVENOUS

## 2021-02-21 MED ORDER — SUGAMMADEX SODIUM 200 MG/2ML IV SOLN
INTRAVENOUS | Status: DC | PRN
  Administered 2021-02-21: 200 mg via INTRAVENOUS

## 2021-02-21 MED ORDER — OXYCODONE HCL 5 MG PO TABS: 5.0000 mg | ORAL_TABLET | Freq: Once | ORAL | Status: DC | PRN

## 2021-02-21 MED ORDER — PROPOFOL 10 MG/ML IV BOLUS
INTRAVENOUS | Status: DC | PRN
  Administered 2021-02-21: 120 mg via INTRAVENOUS

## 2021-02-21 MED ORDER — DEXAMETHASONE SODIUM PHOSPHATE 10 MG/ML IJ SOLN
INTRAMUSCULAR | Status: DC | PRN
  Administered 2021-02-21: 10 mg via INTRAVENOUS

## 2021-02-21 MED ORDER — PROMETHAZINE HCL 25 MG/ML IJ SOLN: 6.2500 mg | INTRAMUSCULAR | Status: DC | PRN

## 2021-02-21 MED ORDER — EPHEDRINE SULFATE 50 MG/ML IJ SOLN
INTRAMUSCULAR | Status: DC | PRN
  Administered 2021-02-21: 10 mg via INTRAVENOUS

## 2021-02-21 MED ORDER — ROCURONIUM BROMIDE 100 MG/10ML IV SOLN
INTRAVENOUS | Status: DC | PRN
  Administered 2021-02-21: 30 mg via INTRAVENOUS
  Administered 2021-02-21: 20 mg via INTRAVENOUS

## 2021-02-21 MED ORDER — NYSTATIN 100000 UNIT/GM EX POWD
Freq: Two times a day (BID) | CUTANEOUS | Status: DC
  Administered 2021-02-21: 1 via TOPICAL
  Filled 2021-02-21: qty 15

## 2021-02-21 MED ORDER — CHLORHEXIDINE GLUCONATE 0.12 % MT SOLN: 15.0000 mL | Freq: Once | OROMUCOSAL | Status: AC

## 2021-02-21 MED ORDER — BELLADONNA ALKALOIDS-OPIUM 16.2-60 MG RE SUPP
RECTAL | Status: AC
  Filled 2021-02-21: qty 1

## 2021-02-21 MED ORDER — CHLORHEXIDINE GLUCONATE 0.12 % MT SOLN
OROMUCOSAL | Status: AC
  Administered 2021-02-21: 15 mL via OROMUCOSAL
  Filled 2021-02-21: qty 15

## 2021-02-21 MED ORDER — MIDAZOLAM HCL 2 MG/2ML IJ SOLN
INTRAMUSCULAR | Status: DC | PRN
  Administered 2021-02-21 (×2): 1 mg via INTRAVENOUS

## 2021-02-21 MED ORDER — FENTANYL CITRATE (PF) 100 MCG/2ML IJ SOLN
INTRAMUSCULAR | Status: DC | PRN
  Administered 2021-02-21: 25 ug via INTRAVENOUS
  Administered 2021-02-21: 50 ug via INTRAVENOUS
  Administered 2021-02-21: 25 ug via INTRAVENOUS

## 2021-02-21 MED ORDER — MIDAZOLAM HCL 2 MG/2ML IJ SOLN
INTRAMUSCULAR | Status: AC
  Filled 2021-02-21: qty 2

## 2021-02-21 MED ORDER — PHENYLEPHRINE HCL (PRESSORS) 10 MG/ML IV SOLN
INTRAVENOUS | Status: DC | PRN
  Administered 2021-02-21: 100 ug via INTRAVENOUS

## 2021-02-21 SURGICAL SUPPLY — 36 items
ADAPTER IRRIG TUBE 2 SPIKE SOL (ADAPTER) ×4 IMPLANT
ADPR TBG 2 SPK PMP STRL ASCP (ADAPTER) ×2
BAG DRN LRG CPC RND TRDRP CNTR (MISCELLANEOUS) ×1
BAG URO DRAIN 4000ML (MISCELLANEOUS) ×2 IMPLANT
CATH FOLEY 3WAY 30CC 24FR (CATHETERS) ×2
CATH URETL 5X70 OPEN END (CATHETERS) ×2 IMPLANT
CATH URTH STD 24FR FL 3W 2 (CATHETERS) ×1 IMPLANT
CONTAINER COLLECT MORCELLATR (MISCELLANEOUS) ×1 IMPLANT
DRAPE UTILITY 15X26 TOWEL STRL (DRAPES) ×2 IMPLANT
ELECT BIVAP BIPO 22/24 DONUT (ELECTROSURGICAL)
ELECTRD BIVAP BIPO 22/24 DONUT (ELECTROSURGICAL) IMPLANT
FIBER LASER MOSES 550 DFL (Laser) IMPLANT
FILTER OVERFLOW MORCELLATOR (FILTER) ×1 IMPLANT
GLOVE SURG UNDER POLY LF SZ7.5 (GLOVE) ×2 IMPLANT
GOWN STRL REUS W/ TWL LRG LVL3 (GOWN DISPOSABLE) ×1 IMPLANT
GOWN STRL REUS W/ TWL XL LVL3 (GOWN DISPOSABLE) ×1 IMPLANT
GOWN STRL REUS W/TWL LRG LVL3 (GOWN DISPOSABLE) ×2
GOWN STRL REUS W/TWL XL LVL3 (GOWN DISPOSABLE) ×2
HOLDER FOLEY CATH W/STRAP (MISCELLANEOUS) ×2 IMPLANT
IV NS IRRIG 3000ML ARTHROMATIC (IV SOLUTION) ×8 IMPLANT
KIT TURNOVER CYSTO (KITS) ×2 IMPLANT
MANIFOLD NEPTUNE II (INSTRUMENTS) IMPLANT
MBRN O SEALING YLW 17 FOR INST (MISCELLANEOUS) ×2
MEMBRANE SLNG YLW 17 FOR INST (MISCELLANEOUS) ×1 IMPLANT
MORCELLATOR COLLECT CONTAINER (MISCELLANEOUS) ×2
MORCELLATOR OVERFLOW FILTER (FILTER) ×2
MORCELLATOR ROTATION 4.75 335 (MISCELLANEOUS) ×2 IMPLANT
PACK CYSTO AR (MISCELLANEOUS) ×2 IMPLANT
SET CYSTO W/LG BORE CLAMP LF (SET/KITS/TRAYS/PACK) ×2 IMPLANT
SET IRRIG Y TYPE TUR BLADDER L (SET/KITS/TRAYS/PACK) ×2 IMPLANT
SLEEVE PROTECTION STRL DISP (MISCELLANEOUS) ×4 IMPLANT
SURGILUBE 2OZ TUBE FLIPTOP (MISCELLANEOUS) ×2 IMPLANT
SYR TOOMEY IRRIG 70ML (MISCELLANEOUS) ×2
SYRINGE TOOMEY IRRIG 70ML (MISCELLANEOUS) ×1 IMPLANT
TUBE PUMP MORCELLATOR PIRANHA (TUBING) ×2 IMPLANT
WATER STERILE IRR 1000ML POUR (IV SOLUTION) ×2 IMPLANT

## 2021-02-21 NOTE — Anesthesia Preprocedure Evaluation (Signed)
Anesthesia Evaluation  Patient identified by MRN, date of birth, ID band Patient awake    Reviewed: Allergy & Precautions, NPO status , Patient's Chart, lab work & pertinent test results  History of Anesthesia Complications Negative for: history of anesthetic complications  Airway Mallampati: II  TM Distance: >3 FB Neck ROM: Full    Dental  (+) Poor Dentition   Pulmonary neg sleep apnea, neg COPD, former smoker,    breath sounds clear to auscultation- rhonchi (-) wheezing      Cardiovascular (-) hypertension(-) CAD, (-) Past MI, (-) Cardiac Stents and (-) CABG  Rhythm:Regular Rate:Normal - Systolic murmurs and - Diastolic murmurs    Neuro/Psych neg Seizures PSYCHIATRIC DISORDERS Anxiety negative neurological ROS     GI/Hepatic negative GI ROS, Neg liver ROS,   Endo/Other  neg diabetesHypothyroidism   Renal/GU Renal InsufficiencyRenal disease (hx of nephrolithiasis)     Musculoskeletal  (+) Arthritis ,   Abdominal (+) - obese,   Peds  Hematology negative hematology ROS (+)   Anesthesia Other Findings Past Medical History: No date: Chronic tophaceous gout No date: Gout No date: History of kidney stones No date: Peripheral neuropathy   Reproductive/Obstetrics                             Anesthesia Physical Anesthesia Plan  ASA: III  Anesthesia Plan: General   Post-op Pain Management:    Induction: Intravenous  PONV Risk Score and Plan: 1 and Ondansetron and Dexamethasone  Airway Management Planned: Oral ETT  Additional Equipment:   Intra-op Plan:   Post-operative Plan: Extubation in OR  Informed Consent: I have reviewed the patients History and Physical, chart, labs and discussed the procedure including the risks, benefits and alternatives for the proposed anesthesia with the patient or authorized representative who has indicated his/her understanding and acceptance.      Dental advisory given  Plan Discussed with: CRNA and Anesthesiologist  Anesthesia Plan Comments:         Anesthesia Quick Evaluation

## 2021-02-21 NOTE — Transfer of Care (Signed)
Immediate Anesthesia Transfer of Care Note  Patient: Mark Jimenez  Procedure(s) Performed: HOLEP-LASER ENUCLEATION OF THE PROSTATE WITH MORCELLATION (N/A )  Patient Location: PACU  Anesthesia Type:General  Level of Consciousness: awake, alert  and oriented  Airway & Oxygen Therapy: Patient Spontanous Breathing and Patient connected to face mask oxygen  Post-op Assessment: Report given to RN  Post vital signs: Reviewed and stable  Last Vitals:  Vitals Value Taken Time  BP    Temp    Pulse    Resp    SpO2      Last Pain:  Vitals:   02/21/21 0854  TempSrc: Oral  PainSc: 6          Complications: No complications documented.

## 2021-02-21 NOTE — Anesthesia Postprocedure Evaluation (Signed)
Anesthesia Post Note  Patient: RHAKEEM VYAS  Procedure(s) Performed: HOLEP-LASER ENUCLEATION OF THE PROSTATE WITH MORCELLATION (N/A )  Patient location during evaluation: PACU Anesthesia Type: General Level of consciousness: awake and alert and oriented Pain management: pain level controlled Vital Signs Assessment: post-procedure vital signs reviewed and stable Respiratory status: spontaneous breathing, nonlabored ventilation and respiratory function stable Cardiovascular status: blood pressure returned to baseline and stable Postop Assessment: no signs of nausea or vomiting Anesthetic complications: no   No complications documented.   Last Vitals:  Vitals:   02/21/21 1346 02/21/21 1414  BP: (!) 116/53 (!) 113/58  Pulse: 63   Resp: 18 18  Temp: (!) 35.8 C   SpO2: 100% 100%    Last Pain:  Vitals:   02/21/21 1414  TempSrc:   PainSc: 3                  Michelene Keniston

## 2021-02-21 NOTE — Anesthesia Procedure Notes (Signed)
Procedure Name: Intubation Date/Time: 02/21/2021 11:31 AM Performed by: Hedda Slade, CRNA Pre-anesthesia Checklist: Patient identified, Patient being monitored, Timeout performed, Emergency Drugs available and Suction available Patient Re-evaluated:Patient Re-evaluated prior to induction Oxygen Delivery Method: Circle system utilized Preoxygenation: Pre-oxygenation with 100% oxygen Induction Type: IV induction Ventilation: Mask ventilation without difficulty Laryngoscope Size: 3 and McGraph Grade View: Grade I Tube type: Oral Tube size: 7.5 mm Number of attempts: 1 Airway Equipment and Method: Stylet Placement Confirmation: ETT inserted through vocal cords under direct vision,  positive ETCO2 and breath sounds checked- equal and bilateral Secured at: 22 cm Tube secured with: Tape Dental Injury: Teeth and Oropharynx as per pre-operative assessment

## 2021-02-21 NOTE — Discharge Instructions (Addendum)
Indwelling Urinary Catheter Care, Adult An indwelling urinary catheter is a thin tube that is put into your bladder. The tube helps to drain pee (urine) out of your body. The tube goes in through your urethra. Your urethra is where pee comes out of your body. Your pee will come out through the catheter, then it will go into a bag (drainage bag). Take good care of your catheter so it will work well. How to wear your catheter and bag Supplies needed  Sticky tape (adhesive tape) or a leg strap.  Alcohol wipe or soap and water (if you use tape).  A clean towel (if you use tape).  Large overnight bag.  Smaller bag (leg bag). Wearing your catheter Attach your catheter to your leg with tape or a leg strap.  Make sure the catheter is not pulled tight.  If a leg strap gets wet, take it off and put on a dry strap.  If you use tape to hold the bag on your leg: 1. Use an alcohol wipe or soap and water to wash your skin where the tape made it sticky before. 2. Use a clean towel to pat-dry that skin. 3. Use new tape to make the bag stay on your leg. Wearing your bags You should have been given a large overnight bag.  You may wear the overnight bag in the day or night.  Always have the overnight bag lower than your bladder.  Do not let the bag touch the floor.  Before you go to sleep, put a clean plastic bag in a wastebasket. Then hang the overnight bag inside the wastebasket. You should also have a smaller leg bag that fits under your clothes.  Always wear the leg bag below your knee.  Do not wear your leg bag at night. How to care for your skin and catheter Supplies needed  A clean washcloth.  Water and mild soap.  A clean towel. Caring for your skin and catheter  Clean the skin around your catheter every day: 1. Wash your hands with soap and water. 2. Wet a clean washcloth in warm water and mild soap. 3. Clean the skin around your urethra.  If you are male:  Gently  spread the folds of skin around your vagina (labia).  With the washcloth in your other hand, wipe the inner side of your labia on each side. Wipe from front to back.  If you are male:  Pull back any skin that covers the end of your penis (foreskin).  With the washcloth in your other hand, wipe your penis in small circles. Start wiping at the tip of your penis, then move away from the catheter.  Move the foreskin back in place, if needed. 4. With your free hand, hold the catheter close to where it goes into your body.  Keep holding the catheter during cleaning so it does not get pulled out. 5. With the washcloth in your other hand, clean the catheter.  Only wipe downward on the catheter.  Do not wipe upward toward your body. Doing this may push germs into your urethra and cause infection. 6. Use a clean towel to pat-dry the catheter and the skin around it. Make sure to wipe off all soap. 7. Wash your hands with soap and water.  Shower every day. Do not take baths.  Do not use cream, ointment, or lotion on the area where the catheter goes into your body, unless your doctor tells you to.  Do not   use powders, sprays, or lotions on your genital area.  Check your skin around the catheter every day for signs of infection. Check for: ? Redness, swelling, or pain. ? Fluid or blood. ? Warmth. ? Pus or a bad smell.      How to empty the bag Supplies needed  Rubbing alcohol.  Gauze pad or cotton ball.  Tape or a leg strap. Emptying the bag Pour the pee out of your bag when it is ?- full, or at least 2-3 times a day. Do this for your overnight bag and your leg bag. 1. Wash your hands with soap and water. 2. Separate (detach) the bag from your leg. 3. Hold the bag over the toilet or a clean pail. Keep the bag lower than your hips and bladder. This is so the pee (urine) does not go back into the tube. 4. Open the pour spout. It is at the bottom of the bag. 5. Empty the pee into the  toilet or pail. Do not let the pour spout touch any surface. 6. Put rubbing alcohol on a gauze pad or cotton ball. 7. Use the gauze pad or cotton ball to clean the pour spout. 8. Close the pour spout. 9. Attach the bag to your leg with tape or a leg strap. 10. Wash your hands with soap and water. Follow instructions for cleaning the drainage bag:  From the product maker.  As told by your doctor. How to change the bag Supplies needed  Alcohol wipes.  A clean bag.  Tape or a leg strap. Changing the bag Replace your bag when it starts to leak, smell bad, or look dirty. 1. Wash your hands with soap and water. 2. Separate the dirty bag from your leg. 3. Pinch the catheter with your fingers so that pee does not spill out. 4. Separate the catheter tube from the bag tube where these tubes connect (at the connection valve). Do not let the tubes touch any surface. 5. Clean the end of the catheter tube with an alcohol wipe. Use a different alcohol wipe to clean the end of the bag tube. 6. Connect the catheter tube to the tube of the clean bag. 7. Attach the clean bag to your leg with tape or a leg strap. Do not make the bag tight on your leg. 8. Wash your hands with soap and water. General rules  Never pull on your catheter. Never try to take it out. Doing that can hurt you.  Always wash your hands before and after you touch your catheter or bag. Use a mild, fragrance-free soap. If you do not have soap and water, use hand sanitizer.  Always make sure there are no twists or bends (kinks) in the catheter tube.  Always make sure there are no leaks in the catheter or bag.  Drink enough fluid to keep your pee pale yellow.  Do not take baths, swim, or use a hot tub.  If you are male, wipe from front to back after you poop (have a bowel movement).   Contact a doctor if:  Your pee is cloudy.  Your pee smells worse than usual.  Your catheter gets clogged.  Your catheter  leaks.  Your bladder feels full. Get help right away if:  You have redness, swelling, or pain where the catheter goes into your body.  You have fluid, blood, pus, or a bad smell coming from the area where the catheter goes into your body.  Your skin feels   warm where the catheter goes into your body.  You have a fever.  You have pain in your: ? Belly (abdomen). ? Legs. ? Lower back. ? Bladder.  You see blood in the catheter.  Your pee is pink or red.  You feel sick to your stomach (nauseous).  You throw up (vomit).  You have chills.  Your pee is not draining into the bag.  Your catheter gets pulled out. Summary  An indwelling urinary catheter is a thin tube that is placed into the bladder to help drain pee (urine) out of the body.  The catheter is placed into the part of the body that drains pee from the bladder (urethra).  Taking good care of your catheter will keep it working properly and help prevent problems.  Always wash your hands before and after touching your catheter or bag.  Never pull on your catheter or try to take it out. This information is not intended to replace advice given to you by your health care provider. Make sure you discuss any questions you have with your health care provider. Document Revised: 02/10/2019 Document Reviewed: 06/04/2017 Elsevier Patient Education  2021 [employer]   AMBULATORY SURGERY  DISCHARGE INSTRUCTIONS   1) The drugs that you were given will stay in your system until tomorrow so for the next 24 hours you should not:  A) Drive an automobile B) Make any legal decisions C) Drink any alcoholic beverage   2) You may resume regular meals tomorrow.  Today it is better to start with liquids and gradually work up to solid foods.  You may eat anything you prefer, but it is better to start with liquids, then soup and crackers, and gradually work up to solid foods.   3) Please notify your doctor immediately if you  have any unusual bleeding, trouble breathing, redness and pain at the surgery site, drainage, fever, or pain not relieved by medication.    4) Additional Instructions:        Please contact your physician with any problems or Same Day Surgery at [phone], Monday through Friday 6 am to 4 pm, or [facility] Main number at [phone]. 

## 2021-02-21 NOTE — Op Note (Signed)
Date of procedure: 02/21/21  Preoperative diagnosis:  1. BPH with obstruction and urinary retention  Postoperative diagnosis:  1. Same  Procedure: 1. HoLEP (Holmium Laser Enucleation of the Prostate)  Surgeon: Nickolas Madrid, MD  Anesthesia: General  Complications: None  Intraoperative findings:  1.  Small tight prostate, high bladder neck.  Ureteral orifices orthotopic bilaterally, mild to moderate bladder trabeculations  2.  Wide open channel at conclusion, transurethral incision performed on right side to further open prostate 3.  Excellent hemostasis, verumontanum and ureteral orifice ease intact at conclusion of case  EBL: Minimal  Specimens: Prostate chips  Enucleation time: 21 minutes  Morcellation time: 1 minute  Intra-op weight: 10 g  Drains: 24 French three-way, 30 cc in balloon  Indication: Mark Jimenez is a 70 y.o. patient with BPH and Foley dependent urinary retention who has failed multiple voiding trials.  After reviewing the management options for treatment, they elected to proceed with the above surgical procedure(s). We have discussed the potential benefits and risks of the procedure, side effects of the proposed treatment, the likelihood of the patient achieving the goals of the procedure, and any potential problems that might occur during the procedure or recuperation.  We specifically discussed the risks of bleeding, infection, hematuria and clot retention, need for additional procedures, possible overnight hospital stay, temporary urgency and incontinence, rare long-term incontinence, and retrograde ejaculation.  Informed consent has been obtained.   Description of procedure:  The patient was taken to the operating room and general anesthesia was induced.  The patient was placed in the dorsal lithotomy position, prepped and draped in the usual sterile fashion, and preoperative antibiotics(Ancef) were administered.  SCDs were placed for DVT prophylaxis.   A preoperative time-out was performed.   Mark Jimenez sounds were used to gently dilated the urethra up to 35F. The 60 French continuous flow resectoscope was inserted into the urethra using the visual obturator  The prostate was small and tight with a high bladder neck. The bladder was thoroughly inspected and notable for mild to moderate trabeculations but no suspicious lesions.  The ureteral orifices were located in orthotopic position.  The laser was set to 2 J and 50 Hz and was used to make a lambda incision just proximal to the verumontanum down to the level of the capsule.  A 6 o'clock incision was then made down to the level of the capsule from the bladder neck to the verumontanum.  The lateral lobes were then incised circumferentially until they were disconnected from the surrounding tissue.  The capsule was examined and laser was used for meticulous hemostasis.    The 13 French resectoscope was then switched out for the 38 French nephroscope and the lobes were morcellated and the tissue sent to pathology.  A 24 French three-way catheter was inserted easily with the aid of a catheter guide, and 30 cc were placed in the balloon.  Urine was clear.  The catheter irrigated easily with a Toomey syringe.  CBI was initiated. A belladonna suppository was placed.  The patient tolerated the procedure well without any immediate complications and was extubated and transferred to the recovery room in stable condition.  Urine was clear on fast CBI.  Disposition: Stable to PACU  Plan: Wean CBI in PACU, anticipate discharge home today with void trial in clinic in 2-3 days  Nickolas Madrid, MD 02/21/2021

## 2021-02-21 NOTE — Interval H&P Note (Signed)
UROLOGY H&P UPDATE  Agree with prior H&P dated 02/04/21.  Comorbid 70 year old male with history of urinary retention and renal failure with right-sided hydronephrosis that resolved after catheter placement.  Has failed multiple voiding trials, and opted for outlet procedure.  Prostate measures 55 g on CT.  Cardiac: RRR Lungs: CTA bilaterally  Laterality: N/A Procedure: HOLEP  Urine: Did not give preop urine, has been on Bactrim to sterilize the urine  We discussed the risks and benefits of HoLEP at length.  The procedure requires general anesthesia and takes 2 to 3 hours, and a holmium laser is used to enucleate the prostate and push this tissue into the bladder.  A morcellator is then used to remove this tissue, which is sent for pathology.  The vast majority of patients are able to discharge the same day with a catheter in place for 2 to 3 days, and will follow-up in clinic for a voiding trial.  Approximately 5% of patients will be admitted overnight to monitor the urine, or if they have multiple co-morbidities.  We specifically discussed the risks of bleeding, infection, retrograde ejaculation, temporary urgency and urge incontinence, very low risk of long-term incontinence, pathologic evaluation of prostate tissue and possible detection of prostate cancer or other malignancy, and possible need for additional procedures.   Billey Co, MD 02/21/2021

## 2021-02-22 ENCOUNTER — Encounter: Payer: Self-pay | Admitting: Urology

## 2021-02-24 ENCOUNTER — Ambulatory Visit: Payer: Medicare HMO

## 2021-02-24 ENCOUNTER — Ambulatory Visit: Payer: Medicare HMO | Admitting: Urology

## 2021-02-25 ENCOUNTER — Other Ambulatory Visit: Payer: Self-pay

## 2021-02-25 ENCOUNTER — Ambulatory Visit: Payer: Medicare HMO | Admitting: Urology

## 2021-02-25 ENCOUNTER — Ambulatory Visit: Payer: Self-pay | Admitting: Urology

## 2021-02-25 ENCOUNTER — Encounter: Payer: Self-pay | Admitting: Urology

## 2021-02-25 VITALS — BP 115/58 | HR 63 | Ht 72.0 in | Wt 160.0 lb

## 2021-02-25 DIAGNOSIS — R339 Retention of urine, unspecified: Secondary | ICD-10-CM

## 2021-02-25 LAB — BLADDER SCAN AMB NON-IMAGING

## 2021-02-25 NOTE — Progress Notes (Signed)
Patient ID: Mark Jimenez, male   DOB: Oct 31, 1951, 70 y.o.   MRN: [id number] Catheter Removal  Patient is present today for a catheter removal.  38m of water was drained from the balloon. A 24FR foley cath was removed from the bladder no complications were noted . Patient tolerated well.  Performed by: DEdwin Dada CMA  Follow up/ Additional notes: follow-up this afternoon

## 2021-02-25 NOTE — Progress Notes (Signed)
   02/25/2021 1:49 PM   Sander Radon 11/03/1950 [id number]  Reason for visit: Follow up HOLEP for BPH  HPI: 70 year old male with numerous comorbidities, recurrent UTIs, and Foley dependent retention who has failed multiple voiding trials.  He underwent an uncomplicated HOLEP and transurethral incision of the prostate on 02/21/2021.  Pathology is still pending.   Foley was removed this morning and he has been able to void multiple times today with a strong stream.  Bladder scan this afternoon 190 mL.  Return precautions discussed extensively, finished course of antibiotics tomorrow.  RTC 10 weeks with PVR We will call with pathology     Billey Co, MD  [facility] [address], [place], [place] [address] [phone]

## 2021-02-26 ENCOUNTER — Telehealth: Payer: Self-pay

## 2021-02-26 DIAGNOSIS — D500 Iron deficiency anemia secondary to blood loss (chronic): Secondary | ICD-10-CM | POA: Diagnosis not present

## 2021-02-26 DIAGNOSIS — G609 Hereditary and idiopathic neuropathy, unspecified: Secondary | ICD-10-CM | POA: Diagnosis not present

## 2021-02-26 DIAGNOSIS — E559 Vitamin D deficiency, unspecified: Secondary | ICD-10-CM | POA: Diagnosis not present

## 2021-02-26 DIAGNOSIS — L719 Rosacea, unspecified: Secondary | ICD-10-CM | POA: Diagnosis not present

## 2021-02-26 DIAGNOSIS — R627 Adult failure to thrive: Secondary | ICD-10-CM | POA: Diagnosis not present

## 2021-02-26 DIAGNOSIS — F5105 Insomnia due to other mental disorder: Secondary | ICD-10-CM | POA: Diagnosis not present

## 2021-02-26 DIAGNOSIS — E872 Acidosis: Secondary | ICD-10-CM | POA: Diagnosis not present

## 2021-02-26 DIAGNOSIS — K8020 Calculus of gallbladder without cholecystitis without obstruction: Secondary | ICD-10-CM | POA: Diagnosis not present

## 2021-02-26 DIAGNOSIS — N184 Chronic kidney disease, stage 4 (severe): Secondary | ICD-10-CM | POA: Diagnosis not present

## 2021-02-26 DIAGNOSIS — K219 Gastro-esophageal reflux disease without esophagitis: Secondary | ICD-10-CM | POA: Diagnosis not present

## 2021-02-26 DIAGNOSIS — N179 Acute kidney failure, unspecified: Secondary | ICD-10-CM | POA: Diagnosis not present

## 2021-02-26 DIAGNOSIS — L89152 Pressure ulcer of sacral region, stage 2: Secondary | ICD-10-CM | POA: Diagnosis not present

## 2021-02-26 DIAGNOSIS — M1A30X0 Chronic gout due to renal impairment, unspecified site, without tophus (tophi): Secondary | ICD-10-CM | POA: Diagnosis not present

## 2021-02-26 DIAGNOSIS — R64 Cachexia: Secondary | ICD-10-CM | POA: Diagnosis not present

## 2021-02-26 DIAGNOSIS — R69 Illness, unspecified: Secondary | ICD-10-CM | POA: Diagnosis not present

## 2021-02-26 DIAGNOSIS — N10 Acute tubulo-interstitial nephritis: Secondary | ICD-10-CM | POA: Diagnosis not present

## 2021-02-26 DIAGNOSIS — E039 Hypothyroidism, unspecified: Secondary | ICD-10-CM | POA: Diagnosis not present

## 2021-02-26 DIAGNOSIS — D631 Anemia in chronic kidney disease: Secondary | ICD-10-CM | POA: Diagnosis not present

## 2021-02-26 DIAGNOSIS — K922 Gastrointestinal hemorrhage, unspecified: Secondary | ICD-10-CM | POA: Diagnosis not present

## 2021-02-26 DIAGNOSIS — N132 Hydronephrosis with renal and ureteral calculous obstruction: Secondary | ICD-10-CM | POA: Diagnosis not present

## 2021-02-26 DIAGNOSIS — R339 Retention of urine, unspecified: Secondary | ICD-10-CM | POA: Diagnosis not present

## 2021-02-26 DIAGNOSIS — E43 Unspecified severe protein-calorie malnutrition: Secondary | ICD-10-CM | POA: Diagnosis not present

## 2021-02-26 DIAGNOSIS — I9589 Other hypotension: Secondary | ICD-10-CM | POA: Diagnosis not present

## 2021-02-26 LAB — SURGICAL PATHOLOGY

## 2021-02-26 NOTE — Telephone Encounter (Signed)
Called pt informed him of the information below. Pt gave verbal understanding.  

## 2021-02-26 NOTE — Telephone Encounter (Signed)
-----   Message from Billey Co, MD sent at 02/26/2021  4:14 PM EDT ----- Good news, no prostate cancer seen on HOLEP tissue.  Keep follow-up as scheduled

## 2021-02-27 DIAGNOSIS — E559 Vitamin D deficiency, unspecified: Secondary | ICD-10-CM | POA: Diagnosis not present

## 2021-02-27 DIAGNOSIS — I9589 Other hypotension: Secondary | ICD-10-CM | POA: Diagnosis not present

## 2021-02-27 DIAGNOSIS — N184 Chronic kidney disease, stage 4 (severe): Secondary | ICD-10-CM | POA: Diagnosis not present

## 2021-02-27 DIAGNOSIS — N179 Acute kidney failure, unspecified: Secondary | ICD-10-CM | POA: Diagnosis not present

## 2021-02-27 DIAGNOSIS — R69 Illness, unspecified: Secondary | ICD-10-CM | POA: Diagnosis not present

## 2021-02-27 DIAGNOSIS — R339 Retention of urine, unspecified: Secondary | ICD-10-CM | POA: Diagnosis not present

## 2021-02-27 DIAGNOSIS — L719 Rosacea, unspecified: Secondary | ICD-10-CM | POA: Diagnosis not present

## 2021-02-27 DIAGNOSIS — R64 Cachexia: Secondary | ICD-10-CM | POA: Diagnosis not present

## 2021-02-27 DIAGNOSIS — E43 Unspecified severe protein-calorie malnutrition: Secondary | ICD-10-CM | POA: Diagnosis not present

## 2021-02-27 DIAGNOSIS — D500 Iron deficiency anemia secondary to blood loss (chronic): Secondary | ICD-10-CM | POA: Diagnosis not present

## 2021-02-27 DIAGNOSIS — L89152 Pressure ulcer of sacral region, stage 2: Secondary | ICD-10-CM | POA: Diagnosis not present

## 2021-02-27 DIAGNOSIS — K8020 Calculus of gallbladder without cholecystitis without obstruction: Secondary | ICD-10-CM | POA: Diagnosis not present

## 2021-02-27 DIAGNOSIS — R627 Adult failure to thrive: Secondary | ICD-10-CM | POA: Diagnosis not present

## 2021-02-27 DIAGNOSIS — N10 Acute tubulo-interstitial nephritis: Secondary | ICD-10-CM | POA: Diagnosis not present

## 2021-02-27 DIAGNOSIS — E039 Hypothyroidism, unspecified: Secondary | ICD-10-CM | POA: Diagnosis not present

## 2021-02-27 DIAGNOSIS — N132 Hydronephrosis with renal and ureteral calculous obstruction: Secondary | ICD-10-CM | POA: Diagnosis not present

## 2021-02-27 DIAGNOSIS — M1A30X0 Chronic gout due to renal impairment, unspecified site, without tophus (tophi): Secondary | ICD-10-CM | POA: Diagnosis not present

## 2021-02-27 DIAGNOSIS — D631 Anemia in chronic kidney disease: Secondary | ICD-10-CM | POA: Diagnosis not present

## 2021-02-27 DIAGNOSIS — F5105 Insomnia due to other mental disorder: Secondary | ICD-10-CM | POA: Diagnosis not present

## 2021-02-27 DIAGNOSIS — E872 Acidosis: Secondary | ICD-10-CM | POA: Diagnosis not present

## 2021-02-27 DIAGNOSIS — K922 Gastrointestinal hemorrhage, unspecified: Secondary | ICD-10-CM | POA: Diagnosis not present

## 2021-02-27 DIAGNOSIS — K219 Gastro-esophageal reflux disease without esophagitis: Secondary | ICD-10-CM | POA: Diagnosis not present

## 2021-02-27 DIAGNOSIS — G609 Hereditary and idiopathic neuropathy, unspecified: Secondary | ICD-10-CM | POA: Diagnosis not present

## 2021-03-03 DIAGNOSIS — N179 Acute kidney failure, unspecified: Secondary | ICD-10-CM | POA: Diagnosis not present

## 2021-03-03 DIAGNOSIS — L719 Rosacea, unspecified: Secondary | ICD-10-CM | POA: Diagnosis not present

## 2021-03-03 DIAGNOSIS — D631 Anemia in chronic kidney disease: Secondary | ICD-10-CM | POA: Diagnosis not present

## 2021-03-03 DIAGNOSIS — R339 Retention of urine, unspecified: Secondary | ICD-10-CM | POA: Diagnosis not present

## 2021-03-03 DIAGNOSIS — L89152 Pressure ulcer of sacral region, stage 2: Secondary | ICD-10-CM | POA: Diagnosis not present

## 2021-03-03 DIAGNOSIS — E039 Hypothyroidism, unspecified: Secondary | ICD-10-CM | POA: Diagnosis not present

## 2021-03-03 DIAGNOSIS — M1A30X0 Chronic gout due to renal impairment, unspecified site, without tophus (tophi): Secondary | ICD-10-CM | POA: Diagnosis not present

## 2021-03-03 DIAGNOSIS — K8020 Calculus of gallbladder without cholecystitis without obstruction: Secondary | ICD-10-CM | POA: Diagnosis not present

## 2021-03-03 DIAGNOSIS — N184 Chronic kidney disease, stage 4 (severe): Secondary | ICD-10-CM | POA: Diagnosis not present

## 2021-03-03 DIAGNOSIS — D500 Iron deficiency anemia secondary to blood loss (chronic): Secondary | ICD-10-CM | POA: Diagnosis not present

## 2021-03-03 DIAGNOSIS — R64 Cachexia: Secondary | ICD-10-CM | POA: Diagnosis not present

## 2021-03-03 DIAGNOSIS — R69 Illness, unspecified: Secondary | ICD-10-CM | POA: Diagnosis not present

## 2021-03-03 DIAGNOSIS — I9589 Other hypotension: Secondary | ICD-10-CM | POA: Diagnosis not present

## 2021-03-03 DIAGNOSIS — E872 Acidosis: Secondary | ICD-10-CM | POA: Diagnosis not present

## 2021-03-03 DIAGNOSIS — R627 Adult failure to thrive: Secondary | ICD-10-CM | POA: Diagnosis not present

## 2021-03-03 DIAGNOSIS — K922 Gastrointestinal hemorrhage, unspecified: Secondary | ICD-10-CM | POA: Diagnosis not present

## 2021-03-03 DIAGNOSIS — N10 Acute tubulo-interstitial nephritis: Secondary | ICD-10-CM | POA: Diagnosis not present

## 2021-03-03 DIAGNOSIS — F5105 Insomnia due to other mental disorder: Secondary | ICD-10-CM | POA: Diagnosis not present

## 2021-03-03 DIAGNOSIS — K219 Gastro-esophageal reflux disease without esophagitis: Secondary | ICD-10-CM | POA: Diagnosis not present

## 2021-03-03 DIAGNOSIS — E559 Vitamin D deficiency, unspecified: Secondary | ICD-10-CM | POA: Diagnosis not present

## 2021-03-03 DIAGNOSIS — G609 Hereditary and idiopathic neuropathy, unspecified: Secondary | ICD-10-CM | POA: Diagnosis not present

## 2021-03-03 DIAGNOSIS — E43 Unspecified severe protein-calorie malnutrition: Secondary | ICD-10-CM | POA: Diagnosis not present

## 2021-03-03 DIAGNOSIS — N132 Hydronephrosis with renal and ureteral calculous obstruction: Secondary | ICD-10-CM | POA: Diagnosis not present

## 2021-03-06 DIAGNOSIS — I9589 Other hypotension: Secondary | ICD-10-CM | POA: Diagnosis not present

## 2021-03-06 DIAGNOSIS — L719 Rosacea, unspecified: Secondary | ICD-10-CM | POA: Diagnosis not present

## 2021-03-06 DIAGNOSIS — E43 Unspecified severe protein-calorie malnutrition: Secondary | ICD-10-CM | POA: Diagnosis not present

## 2021-03-06 DIAGNOSIS — E559 Vitamin D deficiency, unspecified: Secondary | ICD-10-CM | POA: Diagnosis not present

## 2021-03-06 DIAGNOSIS — E039 Hypothyroidism, unspecified: Secondary | ICD-10-CM | POA: Diagnosis not present

## 2021-03-06 DIAGNOSIS — K8020 Calculus of gallbladder without cholecystitis without obstruction: Secondary | ICD-10-CM | POA: Diagnosis not present

## 2021-03-06 DIAGNOSIS — N132 Hydronephrosis with renal and ureteral calculous obstruction: Secondary | ICD-10-CM | POA: Diagnosis not present

## 2021-03-06 DIAGNOSIS — N184 Chronic kidney disease, stage 4 (severe): Secondary | ICD-10-CM | POA: Diagnosis not present

## 2021-03-06 DIAGNOSIS — R627 Adult failure to thrive: Secondary | ICD-10-CM | POA: Diagnosis not present

## 2021-03-06 DIAGNOSIS — D500 Iron deficiency anemia secondary to blood loss (chronic): Secondary | ICD-10-CM | POA: Diagnosis not present

## 2021-03-06 DIAGNOSIS — N179 Acute kidney failure, unspecified: Secondary | ICD-10-CM | POA: Diagnosis not present

## 2021-03-06 DIAGNOSIS — L89152 Pressure ulcer of sacral region, stage 2: Secondary | ICD-10-CM | POA: Diagnosis not present

## 2021-03-06 DIAGNOSIS — E872 Acidosis: Secondary | ICD-10-CM | POA: Diagnosis not present

## 2021-03-06 DIAGNOSIS — K219 Gastro-esophageal reflux disease without esophagitis: Secondary | ICD-10-CM | POA: Diagnosis not present

## 2021-03-06 DIAGNOSIS — F5105 Insomnia due to other mental disorder: Secondary | ICD-10-CM | POA: Diagnosis not present

## 2021-03-06 DIAGNOSIS — K922 Gastrointestinal hemorrhage, unspecified: Secondary | ICD-10-CM | POA: Diagnosis not present

## 2021-03-06 DIAGNOSIS — N10 Acute tubulo-interstitial nephritis: Secondary | ICD-10-CM | POA: Diagnosis not present

## 2021-03-06 DIAGNOSIS — R64 Cachexia: Secondary | ICD-10-CM | POA: Diagnosis not present

## 2021-03-06 DIAGNOSIS — G609 Hereditary and idiopathic neuropathy, unspecified: Secondary | ICD-10-CM | POA: Diagnosis not present

## 2021-03-06 DIAGNOSIS — R69 Illness, unspecified: Secondary | ICD-10-CM | POA: Diagnosis not present

## 2021-03-06 DIAGNOSIS — R339 Retention of urine, unspecified: Secondary | ICD-10-CM | POA: Diagnosis not present

## 2021-03-06 DIAGNOSIS — M1A30X0 Chronic gout due to renal impairment, unspecified site, without tophus (tophi): Secondary | ICD-10-CM | POA: Diagnosis not present

## 2021-03-06 DIAGNOSIS — D631 Anemia in chronic kidney disease: Secondary | ICD-10-CM | POA: Diagnosis not present

## 2021-03-12 ENCOUNTER — Other Ambulatory Visit: Payer: Self-pay | Admitting: Nephrology

## 2021-03-12 DIAGNOSIS — Z9889 Other specified postprocedural states: Secondary | ICD-10-CM | POA: Diagnosis not present

## 2021-03-12 DIAGNOSIS — N179 Acute kidney failure, unspecified: Secondary | ICD-10-CM

## 2021-03-12 DIAGNOSIS — R8290 Unspecified abnormal findings in urine: Secondary | ICD-10-CM

## 2021-03-12 DIAGNOSIS — N1832 Chronic kidney disease, stage 3b: Secondary | ICD-10-CM | POA: Diagnosis not present

## 2021-03-12 DIAGNOSIS — M1000 Idiopathic gout, unspecified site: Secondary | ICD-10-CM | POA: Diagnosis not present

## 2021-03-14 DIAGNOSIS — R627 Adult failure to thrive: Secondary | ICD-10-CM | POA: Diagnosis not present

## 2021-03-14 DIAGNOSIS — E559 Vitamin D deficiency, unspecified: Secondary | ICD-10-CM | POA: Diagnosis not present

## 2021-03-14 DIAGNOSIS — N184 Chronic kidney disease, stage 4 (severe): Secondary | ICD-10-CM | POA: Diagnosis not present

## 2021-03-14 DIAGNOSIS — K8020 Calculus of gallbladder without cholecystitis without obstruction: Secondary | ICD-10-CM | POA: Diagnosis not present

## 2021-03-14 DIAGNOSIS — R69 Illness, unspecified: Secondary | ICD-10-CM | POA: Diagnosis not present

## 2021-03-14 DIAGNOSIS — D631 Anemia in chronic kidney disease: Secondary | ICD-10-CM | POA: Diagnosis not present

## 2021-03-14 DIAGNOSIS — N10 Acute tubulo-interstitial nephritis: Secondary | ICD-10-CM | POA: Diagnosis not present

## 2021-03-14 DIAGNOSIS — G609 Hereditary and idiopathic neuropathy, unspecified: Secondary | ICD-10-CM | POA: Diagnosis not present

## 2021-03-14 DIAGNOSIS — D500 Iron deficiency anemia secondary to blood loss (chronic): Secondary | ICD-10-CM | POA: Diagnosis not present

## 2021-03-14 DIAGNOSIS — N132 Hydronephrosis with renal and ureteral calculous obstruction: Secondary | ICD-10-CM | POA: Diagnosis not present

## 2021-03-14 DIAGNOSIS — R64 Cachexia: Secondary | ICD-10-CM | POA: Diagnosis not present

## 2021-03-14 DIAGNOSIS — F5105 Insomnia due to other mental disorder: Secondary | ICD-10-CM | POA: Diagnosis not present

## 2021-03-14 DIAGNOSIS — L719 Rosacea, unspecified: Secondary | ICD-10-CM | POA: Diagnosis not present

## 2021-03-14 DIAGNOSIS — E039 Hypothyroidism, unspecified: Secondary | ICD-10-CM | POA: Diagnosis not present

## 2021-03-14 DIAGNOSIS — K219 Gastro-esophageal reflux disease without esophagitis: Secondary | ICD-10-CM | POA: Diagnosis not present

## 2021-03-14 DIAGNOSIS — R339 Retention of urine, unspecified: Secondary | ICD-10-CM | POA: Diagnosis not present

## 2021-03-14 DIAGNOSIS — I9589 Other hypotension: Secondary | ICD-10-CM | POA: Diagnosis not present

## 2021-03-14 DIAGNOSIS — M1A30X0 Chronic gout due to renal impairment, unspecified site, without tophus (tophi): Secondary | ICD-10-CM | POA: Diagnosis not present

## 2021-03-14 DIAGNOSIS — K922 Gastrointestinal hemorrhage, unspecified: Secondary | ICD-10-CM | POA: Diagnosis not present

## 2021-03-14 DIAGNOSIS — E43 Unspecified severe protein-calorie malnutrition: Secondary | ICD-10-CM | POA: Diagnosis not present

## 2021-03-14 DIAGNOSIS — N179 Acute kidney failure, unspecified: Secondary | ICD-10-CM | POA: Diagnosis not present

## 2021-03-14 DIAGNOSIS — L89152 Pressure ulcer of sacral region, stage 2: Secondary | ICD-10-CM | POA: Diagnosis not present

## 2021-03-14 DIAGNOSIS — E872 Acidosis: Secondary | ICD-10-CM | POA: Diagnosis not present

## 2021-03-17 DIAGNOSIS — D500 Iron deficiency anemia secondary to blood loss (chronic): Secondary | ICD-10-CM | POA: Diagnosis not present

## 2021-03-17 DIAGNOSIS — F5105 Insomnia due to other mental disorder: Secondary | ICD-10-CM | POA: Diagnosis not present

## 2021-03-17 DIAGNOSIS — N132 Hydronephrosis with renal and ureteral calculous obstruction: Secondary | ICD-10-CM | POA: Diagnosis not present

## 2021-03-17 DIAGNOSIS — R64 Cachexia: Secondary | ICD-10-CM | POA: Diagnosis not present

## 2021-03-17 DIAGNOSIS — E872 Acidosis: Secondary | ICD-10-CM | POA: Diagnosis not present

## 2021-03-17 DIAGNOSIS — E43 Unspecified severe protein-calorie malnutrition: Secondary | ICD-10-CM | POA: Diagnosis not present

## 2021-03-17 DIAGNOSIS — E559 Vitamin D deficiency, unspecified: Secondary | ICD-10-CM | POA: Diagnosis not present

## 2021-03-17 DIAGNOSIS — K219 Gastro-esophageal reflux disease without esophagitis: Secondary | ICD-10-CM | POA: Diagnosis not present

## 2021-03-17 DIAGNOSIS — R69 Illness, unspecified: Secondary | ICD-10-CM | POA: Diagnosis not present

## 2021-03-17 DIAGNOSIS — G609 Hereditary and idiopathic neuropathy, unspecified: Secondary | ICD-10-CM | POA: Diagnosis not present

## 2021-03-17 DIAGNOSIS — L719 Rosacea, unspecified: Secondary | ICD-10-CM | POA: Diagnosis not present

## 2021-03-17 DIAGNOSIS — R627 Adult failure to thrive: Secondary | ICD-10-CM | POA: Diagnosis not present

## 2021-03-17 DIAGNOSIS — M1A30X0 Chronic gout due to renal impairment, unspecified site, without tophus (tophi): Secondary | ICD-10-CM | POA: Diagnosis not present

## 2021-03-17 DIAGNOSIS — K8020 Calculus of gallbladder without cholecystitis without obstruction: Secondary | ICD-10-CM | POA: Diagnosis not present

## 2021-03-17 DIAGNOSIS — K922 Gastrointestinal hemorrhage, unspecified: Secondary | ICD-10-CM | POA: Diagnosis not present

## 2021-03-17 DIAGNOSIS — D631 Anemia in chronic kidney disease: Secondary | ICD-10-CM | POA: Diagnosis not present

## 2021-03-17 DIAGNOSIS — E039 Hypothyroidism, unspecified: Secondary | ICD-10-CM | POA: Diagnosis not present

## 2021-03-17 DIAGNOSIS — N10 Acute tubulo-interstitial nephritis: Secondary | ICD-10-CM | POA: Diagnosis not present

## 2021-03-17 DIAGNOSIS — N179 Acute kidney failure, unspecified: Secondary | ICD-10-CM | POA: Diagnosis not present

## 2021-03-17 DIAGNOSIS — N184 Chronic kidney disease, stage 4 (severe): Secondary | ICD-10-CM | POA: Diagnosis not present

## 2021-03-17 DIAGNOSIS — I9589 Other hypotension: Secondary | ICD-10-CM | POA: Diagnosis not present

## 2021-03-17 DIAGNOSIS — L89152 Pressure ulcer of sacral region, stage 2: Secondary | ICD-10-CM | POA: Diagnosis not present

## 2021-03-17 DIAGNOSIS — R339 Retention of urine, unspecified: Secondary | ICD-10-CM | POA: Diagnosis not present

## 2021-03-19 DIAGNOSIS — G609 Hereditary and idiopathic neuropathy, unspecified: Secondary | ICD-10-CM | POA: Diagnosis not present

## 2021-03-19 DIAGNOSIS — R64 Cachexia: Secondary | ICD-10-CM | POA: Diagnosis not present

## 2021-03-19 DIAGNOSIS — E872 Acidosis: Secondary | ICD-10-CM | POA: Diagnosis not present

## 2021-03-19 DIAGNOSIS — D500 Iron deficiency anemia secondary to blood loss (chronic): Secondary | ICD-10-CM | POA: Diagnosis not present

## 2021-03-19 DIAGNOSIS — F5105 Insomnia due to other mental disorder: Secondary | ICD-10-CM | POA: Diagnosis not present

## 2021-03-19 DIAGNOSIS — K219 Gastro-esophageal reflux disease without esophagitis: Secondary | ICD-10-CM | POA: Diagnosis not present

## 2021-03-19 DIAGNOSIS — R339 Retention of urine, unspecified: Secondary | ICD-10-CM | POA: Diagnosis not present

## 2021-03-19 DIAGNOSIS — N184 Chronic kidney disease, stage 4 (severe): Secondary | ICD-10-CM | POA: Diagnosis not present

## 2021-03-19 DIAGNOSIS — K922 Gastrointestinal hemorrhage, unspecified: Secondary | ICD-10-CM | POA: Diagnosis not present

## 2021-03-19 DIAGNOSIS — K8020 Calculus of gallbladder without cholecystitis without obstruction: Secondary | ICD-10-CM | POA: Diagnosis not present

## 2021-03-19 DIAGNOSIS — I9589 Other hypotension: Secondary | ICD-10-CM | POA: Diagnosis not present

## 2021-03-19 DIAGNOSIS — N179 Acute kidney failure, unspecified: Secondary | ICD-10-CM | POA: Diagnosis not present

## 2021-03-19 DIAGNOSIS — D631 Anemia in chronic kidney disease: Secondary | ICD-10-CM | POA: Diagnosis not present

## 2021-03-19 DIAGNOSIS — N132 Hydronephrosis with renal and ureteral calculous obstruction: Secondary | ICD-10-CM | POA: Diagnosis not present

## 2021-03-19 DIAGNOSIS — E43 Unspecified severe protein-calorie malnutrition: Secondary | ICD-10-CM | POA: Diagnosis not present

## 2021-03-19 DIAGNOSIS — E039 Hypothyroidism, unspecified: Secondary | ICD-10-CM | POA: Diagnosis not present

## 2021-03-19 DIAGNOSIS — M1A30X0 Chronic gout due to renal impairment, unspecified site, without tophus (tophi): Secondary | ICD-10-CM | POA: Diagnosis not present

## 2021-03-19 DIAGNOSIS — E559 Vitamin D deficiency, unspecified: Secondary | ICD-10-CM | POA: Diagnosis not present

## 2021-03-19 DIAGNOSIS — L719 Rosacea, unspecified: Secondary | ICD-10-CM | POA: Diagnosis not present

## 2021-03-19 DIAGNOSIS — R69 Illness, unspecified: Secondary | ICD-10-CM | POA: Diagnosis not present

## 2021-03-19 DIAGNOSIS — R627 Adult failure to thrive: Secondary | ICD-10-CM | POA: Diagnosis not present

## 2021-03-19 DIAGNOSIS — N10 Acute tubulo-interstitial nephritis: Secondary | ICD-10-CM | POA: Diagnosis not present

## 2021-03-19 DIAGNOSIS — L89152 Pressure ulcer of sacral region, stage 2: Secondary | ICD-10-CM | POA: Diagnosis not present

## 2021-03-25 DIAGNOSIS — E872 Acidosis: Secondary | ICD-10-CM | POA: Diagnosis not present

## 2021-03-25 DIAGNOSIS — M1A30X0 Chronic gout due to renal impairment, unspecified site, without tophus (tophi): Secondary | ICD-10-CM | POA: Diagnosis not present

## 2021-03-25 DIAGNOSIS — L89152 Pressure ulcer of sacral region, stage 2: Secondary | ICD-10-CM | POA: Diagnosis not present

## 2021-03-25 DIAGNOSIS — G609 Hereditary and idiopathic neuropathy, unspecified: Secondary | ICD-10-CM | POA: Diagnosis not present

## 2021-03-25 DIAGNOSIS — N179 Acute kidney failure, unspecified: Secondary | ICD-10-CM | POA: Diagnosis not present

## 2021-03-25 DIAGNOSIS — E039 Hypothyroidism, unspecified: Secondary | ICD-10-CM | POA: Diagnosis not present

## 2021-03-25 DIAGNOSIS — R627 Adult failure to thrive: Secondary | ICD-10-CM | POA: Diagnosis not present

## 2021-03-25 DIAGNOSIS — K219 Gastro-esophageal reflux disease without esophagitis: Secondary | ICD-10-CM | POA: Diagnosis not present

## 2021-03-25 DIAGNOSIS — E559 Vitamin D deficiency, unspecified: Secondary | ICD-10-CM | POA: Diagnosis not present

## 2021-03-25 DIAGNOSIS — R69 Illness, unspecified: Secondary | ICD-10-CM | POA: Diagnosis not present

## 2021-03-25 DIAGNOSIS — F5105 Insomnia due to other mental disorder: Secondary | ICD-10-CM | POA: Diagnosis not present

## 2021-03-25 DIAGNOSIS — K8020 Calculus of gallbladder without cholecystitis without obstruction: Secondary | ICD-10-CM | POA: Diagnosis not present

## 2021-03-25 DIAGNOSIS — E43 Unspecified severe protein-calorie malnutrition: Secondary | ICD-10-CM | POA: Diagnosis not present

## 2021-03-25 DIAGNOSIS — R64 Cachexia: Secondary | ICD-10-CM | POA: Diagnosis not present

## 2021-03-25 DIAGNOSIS — L719 Rosacea, unspecified: Secondary | ICD-10-CM | POA: Diagnosis not present

## 2021-03-25 DIAGNOSIS — N10 Acute tubulo-interstitial nephritis: Secondary | ICD-10-CM | POA: Diagnosis not present

## 2021-03-25 DIAGNOSIS — I9589 Other hypotension: Secondary | ICD-10-CM | POA: Diagnosis not present

## 2021-03-25 DIAGNOSIS — N132 Hydronephrosis with renal and ureteral calculous obstruction: Secondary | ICD-10-CM | POA: Diagnosis not present

## 2021-03-25 DIAGNOSIS — N184 Chronic kidney disease, stage 4 (severe): Secondary | ICD-10-CM | POA: Diagnosis not present

## 2021-03-25 DIAGNOSIS — R339 Retention of urine, unspecified: Secondary | ICD-10-CM | POA: Diagnosis not present

## 2021-03-25 DIAGNOSIS — D500 Iron deficiency anemia secondary to blood loss (chronic): Secondary | ICD-10-CM | POA: Diagnosis not present

## 2021-03-25 DIAGNOSIS — K922 Gastrointestinal hemorrhage, unspecified: Secondary | ICD-10-CM | POA: Diagnosis not present

## 2021-03-25 DIAGNOSIS — D631 Anemia in chronic kidney disease: Secondary | ICD-10-CM | POA: Diagnosis not present

## 2021-03-26 DIAGNOSIS — D631 Anemia in chronic kidney disease: Secondary | ICD-10-CM | POA: Diagnosis not present

## 2021-03-26 DIAGNOSIS — R64 Cachexia: Secondary | ICD-10-CM | POA: Diagnosis not present

## 2021-03-26 DIAGNOSIS — R627 Adult failure to thrive: Secondary | ICD-10-CM | POA: Diagnosis not present

## 2021-03-26 DIAGNOSIS — N132 Hydronephrosis with renal and ureteral calculous obstruction: Secondary | ICD-10-CM | POA: Diagnosis not present

## 2021-03-26 DIAGNOSIS — N10 Acute tubulo-interstitial nephritis: Secondary | ICD-10-CM | POA: Diagnosis not present

## 2021-03-26 DIAGNOSIS — E872 Acidosis: Secondary | ICD-10-CM | POA: Diagnosis not present

## 2021-03-26 DIAGNOSIS — E559 Vitamin D deficiency, unspecified: Secondary | ICD-10-CM | POA: Diagnosis not present

## 2021-03-26 DIAGNOSIS — G609 Hereditary and idiopathic neuropathy, unspecified: Secondary | ICD-10-CM | POA: Diagnosis not present

## 2021-03-26 DIAGNOSIS — K8020 Calculus of gallbladder without cholecystitis without obstruction: Secondary | ICD-10-CM | POA: Diagnosis not present

## 2021-03-26 DIAGNOSIS — L719 Rosacea, unspecified: Secondary | ICD-10-CM | POA: Diagnosis not present

## 2021-03-26 DIAGNOSIS — K922 Gastrointestinal hemorrhage, unspecified: Secondary | ICD-10-CM | POA: Diagnosis not present

## 2021-03-26 DIAGNOSIS — D500 Iron deficiency anemia secondary to blood loss (chronic): Secondary | ICD-10-CM | POA: Diagnosis not present

## 2021-03-26 DIAGNOSIS — E43 Unspecified severe protein-calorie malnutrition: Secondary | ICD-10-CM | POA: Diagnosis not present

## 2021-03-26 DIAGNOSIS — E039 Hypothyroidism, unspecified: Secondary | ICD-10-CM | POA: Diagnosis not present

## 2021-03-26 DIAGNOSIS — R339 Retention of urine, unspecified: Secondary | ICD-10-CM | POA: Diagnosis not present

## 2021-03-26 DIAGNOSIS — R69 Illness, unspecified: Secondary | ICD-10-CM | POA: Diagnosis not present

## 2021-03-26 DIAGNOSIS — L89152 Pressure ulcer of sacral region, stage 2: Secondary | ICD-10-CM | POA: Diagnosis not present

## 2021-03-26 DIAGNOSIS — N179 Acute kidney failure, unspecified: Secondary | ICD-10-CM | POA: Diagnosis not present

## 2021-03-26 DIAGNOSIS — K219 Gastro-esophageal reflux disease without esophagitis: Secondary | ICD-10-CM | POA: Diagnosis not present

## 2021-03-26 DIAGNOSIS — I9589 Other hypotension: Secondary | ICD-10-CM | POA: Diagnosis not present

## 2021-03-26 DIAGNOSIS — M1A30X0 Chronic gout due to renal impairment, unspecified site, without tophus (tophi): Secondary | ICD-10-CM | POA: Diagnosis not present

## 2021-03-26 DIAGNOSIS — N184 Chronic kidney disease, stage 4 (severe): Secondary | ICD-10-CM | POA: Diagnosis not present

## 2021-03-26 DIAGNOSIS — F5105 Insomnia due to other mental disorder: Secondary | ICD-10-CM | POA: Diagnosis not present

## 2021-04-09 DIAGNOSIS — D509 Iron deficiency anemia, unspecified: Secondary | ICD-10-CM | POA: Diagnosis not present

## 2021-04-09 DIAGNOSIS — R7401 Elevation of levels of liver transaminase levels: Secondary | ICD-10-CM | POA: Diagnosis not present

## 2021-04-28 DIAGNOSIS — R627 Adult failure to thrive: Secondary | ICD-10-CM | POA: Diagnosis not present

## 2021-04-28 DIAGNOSIS — I959 Hypotension, unspecified: Secondary | ICD-10-CM | POA: Diagnosis not present

## 2021-04-28 DIAGNOSIS — M1A00X1 Idiopathic chronic gout, unspecified site, with tophus (tophi): Secondary | ICD-10-CM | POA: Diagnosis not present

## 2021-04-28 DIAGNOSIS — K921 Melena: Secondary | ICD-10-CM | POA: Diagnosis not present

## 2021-04-28 DIAGNOSIS — G609 Hereditary and idiopathic neuropathy, unspecified: Secondary | ICD-10-CM | POA: Diagnosis not present

## 2021-04-28 DIAGNOSIS — R69 Illness, unspecified: Secondary | ICD-10-CM | POA: Diagnosis not present

## 2021-04-28 DIAGNOSIS — D500 Iron deficiency anemia secondary to blood loss (chronic): Secondary | ICD-10-CM | POA: Diagnosis not present

## 2021-04-28 DIAGNOSIS — E43 Unspecified severe protein-calorie malnutrition: Secondary | ICD-10-CM | POA: Diagnosis not present

## 2021-04-28 DIAGNOSIS — G4700 Insomnia, unspecified: Secondary | ICD-10-CM | POA: Diagnosis not present

## 2021-04-28 DIAGNOSIS — K219 Gastro-esophageal reflux disease without esophagitis: Secondary | ICD-10-CM | POA: Diagnosis not present

## 2021-05-06 ENCOUNTER — Other Ambulatory Visit: Payer: Self-pay

## 2021-05-06 ENCOUNTER — Ambulatory Visit: Payer: Medicare HMO | Admitting: Urology

## 2021-05-06 ENCOUNTER — Ambulatory Visit
Admission: RE | Admit: 2021-05-06 | Discharge: 2021-05-06 | Disposition: A | Discharge status: Home / Self Care | Payer: Medicare HMO | Source: Ambulatory Visit | Attending: Nephrology | Admitting: Nephrology

## 2021-05-06 DIAGNOSIS — N179 Acute kidney failure, unspecified: Secondary | ICD-10-CM | POA: Diagnosis not present

## 2021-05-06 DIAGNOSIS — R8290 Unspecified abnormal findings in urine: Secondary | ICD-10-CM | POA: Diagnosis not present

## 2021-05-06 DIAGNOSIS — N1832 Chronic kidney disease, stage 3b: Secondary | ICD-10-CM | POA: Diagnosis not present

## 2021-05-13 ENCOUNTER — Ambulatory Visit: Payer: Medicare HMO | Admitting: Urology

## 2021-05-21 DIAGNOSIS — N1832 Chronic kidney disease, stage 3b: Secondary | ICD-10-CM | POA: Diagnosis not present

## 2021-05-21 DIAGNOSIS — M1000 Idiopathic gout, unspecified site: Secondary | ICD-10-CM | POA: Diagnosis not present

## 2021-05-21 DIAGNOSIS — D631 Anemia in chronic kidney disease: Secondary | ICD-10-CM | POA: Diagnosis not present

## 2021-05-21 DIAGNOSIS — E039 Hypothyroidism, unspecified: Secondary | ICD-10-CM | POA: Diagnosis not present

## 2021-05-21 DIAGNOSIS — D509 Iron deficiency anemia, unspecified: Secondary | ICD-10-CM | POA: Diagnosis not present

## 2021-05-21 DIAGNOSIS — E559 Vitamin D deficiency, unspecified: Secondary | ICD-10-CM | POA: Diagnosis not present

## 2021-07-23 DIAGNOSIS — N184 Chronic kidney disease, stage 4 (severe): Secondary | ICD-10-CM | POA: Diagnosis not present

## 2021-07-23 DIAGNOSIS — E039 Hypothyroidism, unspecified: Secondary | ICD-10-CM | POA: Diagnosis not present

## 2021-07-23 DIAGNOSIS — D631 Anemia in chronic kidney disease: Secondary | ICD-10-CM | POA: Diagnosis not present

## 2021-07-23 DIAGNOSIS — N1832 Chronic kidney disease, stage 3b: Secondary | ICD-10-CM | POA: Diagnosis not present

## 2021-07-23 DIAGNOSIS — M1000 Idiopathic gout, unspecified site: Secondary | ICD-10-CM | POA: Diagnosis not present

## 2021-07-23 DIAGNOSIS — E559 Vitamin D deficiency, unspecified: Secondary | ICD-10-CM | POA: Diagnosis not present

## 2021-07-23 DIAGNOSIS — I950 Idiopathic hypotension: Secondary | ICD-10-CM | POA: Diagnosis not present

## 2021-07-23 DIAGNOSIS — D500 Iron deficiency anemia secondary to blood loss (chronic): Secondary | ICD-10-CM | POA: Diagnosis not present

## 2021-07-23 DIAGNOSIS — I10 Essential (primary) hypertension: Secondary | ICD-10-CM | POA: Diagnosis not present

## 2021-07-25 ENCOUNTER — Encounter: Admission: RE | Payer: Self-pay | Source: Ambulatory Visit

## 2021-07-25 ENCOUNTER — Ambulatory Visit: Admission: RE | Admit: 2021-07-25 | Payer: Medicare HMO | Source: Ambulatory Visit

## 2021-07-25 SURGERY — EGD (ESOPHAGOGASTRODUODENOSCOPY)
Anesthesia: General

## 2021-12-30 ENCOUNTER — Encounter: Payer: Self-pay | Admitting: Ophthalmology

## 2022-01-01 NOTE — Discharge Instructions (Signed)

## 2022-01-06 ENCOUNTER — Encounter: Payer: Self-pay | Admitting: Ophthalmology

## 2022-01-06 ENCOUNTER — Ambulatory Visit: Payer: Medicare Other | Admitting: Anesthesiology

## 2022-01-06 ENCOUNTER — Ambulatory Visit
Admission: RE | Admit: 2022-01-06 | Discharge: 2022-01-06 | Disposition: A | Discharge status: Home / Self Care | Payer: Medicare Other | Source: Ambulatory Visit | Attending: Ophthalmology | Admitting: Ophthalmology

## 2022-01-06 ENCOUNTER — Encounter
Admission: RE | Disposition: A | Discharge status: Home / Self Care | Payer: Self-pay | Source: Ambulatory Visit | Attending: Ophthalmology

## 2022-01-06 ENCOUNTER — Other Ambulatory Visit: Payer: Self-pay

## 2022-01-06 DIAGNOSIS — K219 Gastro-esophageal reflux disease without esophagitis: Secondary | ICD-10-CM | POA: Insufficient documentation

## 2022-01-06 DIAGNOSIS — H2512 Age-related nuclear cataract, left eye: Secondary | ICD-10-CM | POA: Diagnosis not present

## 2022-01-06 DIAGNOSIS — Z87891 Personal history of nicotine dependence: Secondary | ICD-10-CM | POA: Insufficient documentation

## 2022-01-06 HISTORY — PX: CATARACT EXTRACTION W/PHACO: SHX586

## 2022-01-06 HISTORY — DX: Presence of dental prosthetic device (complete) (partial): Z97.2

## 2022-01-06 HISTORY — DX: Gastro-esophageal reflux disease without esophagitis: K21.9

## 2022-01-06 SURGERY — PHACOEMULSIFICATION, CATARACT, WITH IOL INSERTION
Anesthesia: Monitor Anesthesia Care | Site: Eye | Laterality: Left

## 2022-01-06 MED ORDER — ACETAMINOPHEN 160 MG/5ML PO SOLN: 975.0000 mg | Freq: Once | ORAL | Status: DC | PRN

## 2022-01-06 MED ORDER — SIGHTPATH DOSE#1 NA CHONDROIT SULF-NA HYALURON 40-17 MG/ML IO SOLN
INTRAOCULAR | Status: DC | PRN
  Administered 2022-01-06: 1 mL via INTRAOCULAR

## 2022-01-06 MED ORDER — SIGHTPATH DOSE#1 BSS IO SOLN
INTRAOCULAR | Status: DC | PRN
  Administered 2022-01-06: 15 mL via INTRAOCULAR

## 2022-01-06 MED ORDER — ONDANSETRON HCL 4 MG/2ML IJ SOLN: 4.0000 mg | Freq: Once | INTRAMUSCULAR | Status: DC | PRN

## 2022-01-06 MED ORDER — MIDAZOLAM HCL 2 MG/2ML IJ SOLN
INTRAMUSCULAR | Status: DC | PRN
  Administered 2022-01-06: 2 mg via INTRAVENOUS

## 2022-01-06 MED ORDER — SIGHTPATH DOSE#1 BSS IO SOLN
INTRAOCULAR | Status: DC | PRN
  Administered 2022-01-06: 59 mL via OPHTHALMIC

## 2022-01-06 MED ORDER — TETRACAINE HCL 0.5 % OP SOLN
1.0000 [drp] | OPHTHALMIC | Status: DC | PRN
  Administered 2022-01-06 (×3): 1 [drp] via OPHTHALMIC

## 2022-01-06 MED ORDER — ACETAMINOPHEN 500 MG PO TABS: 1000.0000 mg | ORAL_TABLET | Freq: Once | ORAL | Status: DC | PRN

## 2022-01-06 MED ORDER — FENTANYL CITRATE (PF) 100 MCG/2ML IJ SOLN
INTRAMUSCULAR | Status: DC | PRN
  Administered 2022-01-06: 50 ug via INTRAVENOUS

## 2022-01-06 MED ORDER — SIGHTPATH DOSE#1 BSS IO SOLN
INTRAOCULAR | Status: DC | PRN
  Administered 2022-01-06: 2 mL

## 2022-01-06 MED ORDER — LACTATED RINGERS IV SOLN: INTRAVENOUS | Status: DC

## 2022-01-06 MED ORDER — BRIMONIDINE TARTRATE-TIMOLOL 0.2-0.5 % OP SOLN
OPHTHALMIC | Status: DC | PRN
  Administered 2022-01-06: 1 [drp] via OPHTHALMIC

## 2022-01-06 MED ORDER — ARMC OPHTHALMIC DILATING DROPS
1.0000 "application " | OPHTHALMIC | Status: DC | PRN
  Administered 2022-01-06 (×3): 1 via OPHTHALMIC

## 2022-01-06 MED ORDER — MOXIFLOXACIN HCL 0.5 % OP SOLN
OPHTHALMIC | Status: DC | PRN
Start: 2022-01-06 — End: 2022-01-06
  Administered 2022-01-06: 0.2 mL via OPHTHALMIC

## 2022-01-06 SURGICAL SUPPLY — 12 items
CANNULA ANT/CHMB 27G (MISCELLANEOUS) IMPLANT
CANNULA ANT/CHMB 27GA (MISCELLANEOUS) IMPLANT
CATARACT SUITE SIGHTPATH (MISCELLANEOUS) ×3 IMPLANT
FEE CATARACT SUITE SIGHTPATH (MISCELLANEOUS) ×1 IMPLANT
GLOVE SURG ENC TEXT LTX SZ8 (GLOVE) ×3 IMPLANT
GLOVE SURG TRIUMPH 8.0 PF LTX (GLOVE) ×3 IMPLANT
LENS IOL TECNIS EYHANCE 20.0 (Intraocular Lens) ×2 IMPLANT
NDL FILTER BLUNT 18X1 1/2 (NEEDLE) ×1 IMPLANT
NEEDLE FILTER BLUNT 18X 1/2SAF (NEEDLE) ×2
NEEDLE FILTER BLUNT 18X1 1/2 (NEEDLE) ×1 IMPLANT
SYR 3ML LL SCALE MARK (SYRINGE) ×3 IMPLANT
WATER STERILE IRR 250ML POUR (IV SOLUTION) ×3 IMPLANT

## 2022-01-06 NOTE — Op Note (Signed)
PREOPERATIVE DIAGNOSIS:  Nuclear sclerotic cataract of the left eye. ?  ?POSTOPERATIVE DIAGNOSIS:  Nuclear sclerotic cataract of the left eye. ?  ?OPERATIVE PROCEDURE:ORPROCALL@ ?  ?SURGEON:  Mark Robson, MD. ?  ?ANESTHESIA: ? ?Anesthesiologist: April Manson, MD ?CRNA: Jeannene Patella, CRNA ? ?1.      Managed anesthesia care. ?2.     0.35ml of Shugarcaine was instilled following the paracentesis ?  ?COMPLICATIONS:  None. ?  ?TECHNIQUE:   Stop and chop ?  ?DESCRIPTION OF PROCEDURE:  The patient was examined and consented in the preoperative holding area where the aforementioned topical anesthesia was applied to the left eye and then brought back to the Operating Room where the left eye was prepped and draped in the usual sterile ophthalmic fashion and a lid speculum was placed. A paracentesis was created with the side port blade and the anterior chamber was filled with viscoelastic. A near clear corneal incision was performed with the steel keratome. A continuous curvilinear capsulorrhexis was performed with a cystotome followed by the capsulorrhexis forceps. Hydrodissection and hydrodelineation were carried out with BSS on a blunt cannula. The lens was removed in a stop and chop  technique and the remaining cortical material was removed with the irrigation-aspiration handpiece. The capsular bag was inflated with viscoelastic and the Technis [id number] lens was placed in the capsular bag without complication. The remaining viscoelastic was removed from the eye with the irrigation-aspiration handpiece. The wounds were hydrated. The anterior chamber was flushed with BSS and the eye was inflated to physiologic pressure. 0.63ml Vigamox was placed in the anterior chamber. The wounds were found to be water tight. The eye was dressed with Combigan. The patient was given protective glasses to wear throughout the day and a shield with which to sleep tonight. The patient was also given drops with which to begin a drop  regimen today and will follow-up with me in one day. ?Implant Name Type Inv. Item Serial No. Manufacturer Lot No. LRB No. Used Action  ?LENS IOL TECNIS EYHANCE 20.0 - [id number] Intraocular Lens LENS IOL TECNIS EYHANCE 20.0 [id number] [employer]  Left 1 Implanted  ?  ?Procedure(s): ?CATARACT EXTRACTION PHACO AND INTRAOCULAR LENS PLACEMENT (IOC) LEFT 5.16 00:41.4 (Left) ? ?Electronically signed: Birder Jimenez 01/06/2022 9:48 AM ? ?

## 2022-01-06 NOTE — Anesthesia Procedure Notes (Signed)
Procedure Name: [place] ?Date/Time: 01/06/2022 9:35 AM ?Performed by: Jeannene Patella, CRNA ?Pre-anesthesia Checklist: Patient identified, Emergency Drugs available, Suction available, Timeout performed and Patient being monitored ?Patient Re-evaluated:Patient Re-evaluated prior to induction ?Oxygen Delivery Method: Nasal cannula ?Placement Confirmation: positive ETCO2 ? ? ? ? ?

## 2022-01-06 NOTE — Anesthesia Postprocedure Evaluation (Addendum)
Anesthesia Post Note ? ?Patient: Mark Jimenez ? ?Procedure(s) Performed: CATARACT EXTRACTION PHACO AND INTRAOCULAR LENS PLACEMENT (IOC) LEFT 5.16 00:41.4 (Left: Eye) ? ? ?  ?Patient location during evaluation: PACU ?Anesthesia Type: MAC ?Level of consciousness: awake and alert ?Pain management: pain level controlled ?Vital Signs Assessment: post-procedure vital signs reviewed and stable ?Respiratory status: spontaneous breathing, nonlabored ventilation and respiratory function stable ?Cardiovascular status: stable and blood pressure returned to baseline ?Postop Assessment: no apparent nausea or vomiting ?Anesthetic complications: no ? ? ?No notable events documented. ? ?April Manson ? ? ? ? ? ?

## 2022-01-06 NOTE — Transfer of Care (Signed)
Immediate Anesthesia Transfer of Care Note ? ?Patient: Mark Jimenez ? ?Procedure(s) Performed: CATARACT EXTRACTION PHACO AND INTRAOCULAR LENS PLACEMENT (IOC) LEFT 5.16 00:41.4 (Left: Eye) ? ?Patient Location: PACU ? ?Anesthesia Type: No value filed. ? ?Level of Consciousness: awake, alert  and patient cooperative ? ?Airway and Oxygen Therapy: Patient Spontanous Breathing and Patient connected to supplemental oxygen ? ?Post-op Assessment: Post-op Vital signs reviewed, Patient's Cardiovascular Status Stable, Respiratory Function Stable, Patent Airway and No signs of Nausea or vomiting ? ?Post-op Vital Signs: Reviewed and stable ? ?Complications: No notable events documented. ? ?

## 2022-01-06 NOTE — Anesthesia Preprocedure Evaluation (Addendum)
Anesthesia Evaluation  ?Patient identified by MRN, date of birth, ID band ?Patient awake ? ? ? ?Reviewed: ?Allergy & Precautions, H&P , NPO status , Patient's Chart, lab work & pertinent test results, reviewed documented beta blocker date and time  ? ?Airway ?Mallampati: II ? ?TM Distance: >3 FB ?Neck ROM: full ? ? ? Dental ?no notable dental hx. ? ?  ?Pulmonary ?neg pulmonary ROS, former smoker,  ?  ?Pulmonary exam normal ?breath sounds clear to auscultation ? ? ? ? ? ? Cardiovascular ?Exercise Tolerance: Good ?negative cardio ROS ? ? ?Rhythm:regular Rate:Normal ? ? ?  ?Neuro/Psych ?negative neurological ROS ? negative psych ROS  ? GI/Hepatic ?negative GI ROS, Neg liver ROS, GERD  ,  ?Endo/Other  ?negative endocrine ROS ? Renal/GU ?Renal disease (stones)negative Renal ROS  ?negative genitourinary ?  ?Musculoskeletal ? ?(+) Arthritis  (tophaceous gout),  ? Abdominal ?  ?Peds ? Hematology ?negative hematology ROS ?(+)   ?Anesthesia Other Findings ? ? Reproductive/Obstetrics ?negative OB ROS ? ?  ? ? ? ? ? ? ? ? ? ? ? ? ? ?  ?  ? ? ? ? ? ? ? ? ?Anesthesia Physical ?Anesthesia Plan ? ?ASA: 2 ? ?Anesthesia Plan: MAC  ? ?Post-op Pain Management:   ? ?Induction:  ? ?PONV Risk Score and Plan: 1 and Midazolam, TIVA and Treatment may vary due to age or medical condition ? ?Airway Management Planned:  ? ?Additional Equipment:  ? ?Intra-op Plan:  ? ?Post-operative Plan:  ? ?Informed Consent: I have reviewed the patients History and Physical, chart, labs and discussed the procedure including the risks, benefits and alternatives for the proposed anesthesia with the patient or authorized representative who has indicated his/her understanding and acceptance.  ? ? ? ?Dental Advisory Given ? ?Plan Discussed with: CRNA ? ?Anesthesia Plan Comments:   ? ? ? ? ? ? ?Anesthesia Quick Evaluation ? ?

## 2022-01-06 NOTE — H&P (Signed)
[place]  ? ?Primary Care Physician:  [place] ?Ophthalmologist: Dr. George Ina ? ?Pre-Procedure History & Physical: ?HPI:  Mark Jimenez is a 71 y.o. male here for cataract surgery. ?  ?Past Medical History:  ?Diagnosis Date  ? Chronic tophaceous gout   ? GERD (gastroesophageal reflux disease)   ? Gout   ? History of kidney stones   ? Peripheral neuropathy   ? Wears dentures   ? partial upper  ? ? ?Past Surgical History:  ?Procedure Laterality Date  ? EYE SURGERY    ? HOLEP-LASER ENUCLEATION OF THE PROSTATE WITH MORCELLATION N/A 02/21/2021  ? Procedure: HOLEP-LASER ENUCLEATION OF THE PROSTATE WITH MORCELLATION;  Surgeon: Billey Co, MD;  Location: [facility] ORS;  Service: Urology;  Laterality: N/A;  ? TONSILLECTOMY    ? TOTAL HIP ARTHROPLASTY Left 08/31/2020  ? Procedure: TOTAL HIP ARTHROPLASTY ANTERIOR APPROACH;  Surgeon: Hessie Knows, MD;  Location: [facility] ORS;  Service: Orthopedics;  Laterality: Left;  ? VARICOSE VEIN SURGERY Right   ? ? ?Prior to Admission medications   ?Medication Sig Start Date End Date Taking? Authorizing Provider  ?acetaminophen (TYLENOL) 500 MG tablet Take 1,000 mg by mouth 2 (two) times daily as needed for moderate pain or mild pain. 01/07/21  Yes [provider]  ?ascorbic acid (VITAMIN C) 500 MG tablet Take 500 mg by mouth in the morning and at bedtime. 01/28/21  Yes [provider]  ?calcium carbonate (TUMS - DOSED IN MG ELEMENTAL CALCIUM) 500 MG chewable tablet Chew 1 tablet (200 mg of elemental calcium total) by mouth daily. ?Patient taking differently: Chew 200 mg of elemental calcium by mouth daily as needed for indigestion or heartburn. 09/05/20  Yes Fritzi Mandes, MD  ?Cholecalciferol (VITAMIN D3) 50 MCG (2000 UT) TABS Take 2,000 Units by mouth daily.   Yes [provider]  ?ferrous sulfate 325 (65 FE) MG tablet Take 325 mg by mouth in the morning and at bedtime. 01/28/21  Yes [provider]  ?magnesium oxide (MAG-OX) 400 MG  tablet Take 400 mg by mouth daily. 01/28/21  Yes [provider]  ?nystatin (MYCOSTATIN/NYSTOP) powder Apply 1 application topically 3 (three) times daily. Apply to scrotum rash 02/18/21  Yes Billey Co, MD  ?Omeprazole 20 MG TBDD Take 20 mg by mouth 2 (two) times daily as needed.   Yes [provider]  ?predniSONE (DELTASONE) 10 MG tablet Take 10 mg by mouth daily. 01/25/21  Yes [provider]  ?diclofenac Sodium (VOLTAREN) 1 % GEL Apply 2 g topically daily as needed (pain). ?Patient not taking: Reported on 12/30/2021 01/07/21 01/07/22  [provider]  ?Zinc Oxide (BOUDREAUXS BUTT PASTE) 16 % OINT Apply 1 application topically daily as needed (rash). ?Patient not taking: Reported on 12/30/2021 01/25/21   [provider]  ? ? ?Allergies as of 11/25/2021 - Review Complete 02/25/2021  ?Allergen Reaction Noted  ? Other  01/24/2021  ? Tape  02/20/2021  ? Allopurinol Nausea And Vomiting 02/20/2016  ? Febuxostat Nausea And Vomiting 02/20/2016  ? ? ?History reviewed. No pertinent family history. ? ?Social History  ? ?Socioeconomic History  ? Marital status: Married  ?  Spouse name: Chauncy Lean  ? Number of children: Not on file  ? Years of education: Not on file  ? Highest education level: Not on file  ?Occupational History  ? Not on file  ?Tobacco Use  ? Smoking status: Former  ? Smokeless tobacco: Current  ?  Types: Chew  ?  Tobacco comments:  ?  "Chews on a cigar" sometimes  ?Vaping Use  ? Vaping Use: Never used  ?Substance and Sexual Activity  ? Alcohol use: Not Currently  ?  Alcohol/week: 2.0 standard drinks  ?  Types: 2 Cans of beer per week  ?  Comment: None since 08/30/20  ? Drug use: Not Currently  ? Sexual activity: Not on file  ?Other Topics Concern  ? Not on file  ?Social History Narrative  ? Not on file  ? ?Social Determinants of Health  ? ?Financial Resource Strain: Not on file  ?Food Insecurity: Not on file  ?Transportation Needs: Not on file  ?Physical Activity:  Not on file  ?Stress: Not on file  ?Social Connections: Not on file  ?Intimate Partner Violence: Not on file  ? ? ?Review of Systems: ?See HPI, otherwise negative ROS ? ?Physical Exam: ?BP 135/62   Pulse 66   Temp (!) 97 ?F (36.1 ?C) (Temporal)   Resp 18   Ht 6' (1.829 m)   Wt 82.1 kg   SpO2 100%   BMI 24.55 kg/m?  ?General:   Alert, cooperative in NAD ?Head:  Normocephalic and atraumatic. ?Respiratory:  Normal work of breathing. ?Cardiovascular:  RRR ? ?Impression/Plan: ?Mark Jimenez is here for cataract surgery. ? ?Risks, benefits, limitations, and alternatives regarding cataract surgery have been reviewed with the patient.  Questions have been answered.  All parties agreeable. ? ? ?Birder Robson, MD  01/06/2022, 9:25 AM ? ? ?

## 2022-01-07 ENCOUNTER — Encounter: Payer: Self-pay | Admitting: Ophthalmology

## 2022-04-04 ENCOUNTER — Other Ambulatory Visit: Payer: Self-pay | Admitting: Gerontology

## 2022-04-04 DIAGNOSIS — Z87828 Personal history of other (healed) physical injury and trauma: Secondary | ICD-10-CM

## 2022-04-04 DIAGNOSIS — H538 Other visual disturbances: Secondary | ICD-10-CM

## 2022-04-04 DIAGNOSIS — R42 Dizziness and giddiness: Secondary | ICD-10-CM

## 2022-04-09 ENCOUNTER — Ambulatory Visit
Admission: RE | Admit: 2022-04-09 | Discharge: 2022-04-09 | Disposition: A | Discharge status: Home / Self Care | Payer: Medicare Other | Source: Ambulatory Visit | Attending: Gerontology | Admitting: Gerontology

## 2022-04-09 DIAGNOSIS — Z87828 Personal history of other (healed) physical injury and trauma: Secondary | ICD-10-CM

## 2022-04-09 DIAGNOSIS — R42 Dizziness and giddiness: Secondary | ICD-10-CM | POA: Diagnosis not present

## 2022-04-09 DIAGNOSIS — H538 Other visual disturbances: Secondary | ICD-10-CM

## 2022-10-18 ENCOUNTER — Emergency Department: Payer: Medicare HMO

## 2022-10-18 ENCOUNTER — Emergency Department
Admission: EM | Admit: 2022-10-18 | Discharge: 2022-10-18 | Disposition: A | Discharge status: Home / Self Care | Payer: Medicare HMO | Attending: Emergency Medicine | Admitting: Emergency Medicine

## 2022-10-18 ENCOUNTER — Other Ambulatory Visit: Payer: Self-pay

## 2022-10-18 DIAGNOSIS — N189 Chronic kidney disease, unspecified: Secondary | ICD-10-CM | POA: Insufficient documentation

## 2022-10-18 DIAGNOSIS — R1011 Right upper quadrant pain: Secondary | ICD-10-CM | POA: Diagnosis present

## 2022-10-18 DIAGNOSIS — Z20822 Contact with and (suspected) exposure to covid-19: Secondary | ICD-10-CM | POA: Diagnosis not present

## 2022-10-18 DIAGNOSIS — E039 Hypothyroidism, unspecified: Secondary | ICD-10-CM | POA: Diagnosis not present

## 2022-10-18 DIAGNOSIS — R001 Bradycardia, unspecified: Secondary | ICD-10-CM | POA: Diagnosis not present

## 2022-10-18 DIAGNOSIS — R1010 Upper abdominal pain, unspecified: Secondary | ICD-10-CM | POA: Diagnosis not present

## 2022-10-18 DIAGNOSIS — N281 Cyst of kidney, acquired: Secondary | ICD-10-CM | POA: Diagnosis not present

## 2022-10-18 DIAGNOSIS — R112 Nausea with vomiting, unspecified: Secondary | ICD-10-CM | POA: Diagnosis not present

## 2022-10-18 DIAGNOSIS — K8020 Calculus of gallbladder without cholecystitis without obstruction: Secondary | ICD-10-CM | POA: Diagnosis not present

## 2022-10-18 DIAGNOSIS — I10 Essential (primary) hypertension: Secondary | ICD-10-CM | POA: Diagnosis not present

## 2022-10-18 DIAGNOSIS — Z743 Need for continuous supervision: Secondary | ICD-10-CM | POA: Diagnosis not present

## 2022-10-18 LAB — COMPREHENSIVE METABOLIC PANEL
ALT: 82 U/L — ABNORMAL HIGH (ref 0–44)
AST: 49 U/L — ABNORMAL HIGH (ref 15–41)
Albumin: 4.2 g/dL (ref 3.5–5.0)
Alkaline Phosphatase: 159 U/L — ABNORMAL HIGH (ref 38–126)
Anion gap: 12 (ref 5–15)
BUN: 24 mg/dL — ABNORMAL HIGH (ref 8–23)
CO2: 19 mmol/L — ABNORMAL LOW (ref 22–32)
Calcium: 9.2 mg/dL (ref 8.9–10.3)
Chloride: 107 mmol/L (ref 98–111)
Creatinine, Ser: 2.11 mg/dL — ABNORMAL HIGH (ref 0.61–1.24)
GFR, Estimated: 32 mL/min — ABNORMAL LOW (ref >60)
Glucose, Bld: 150 mg/dL — ABNORMAL HIGH (ref 70–99)
Potassium: 3.9 mmol/L (ref 3.5–5.1)
Sodium: 138 mmol/L (ref 135–145)
Total Bilirubin: 1.5 mg/dL — ABNORMAL HIGH (ref 0.3–1.2)
Total Protein: 7.9 g/dL (ref 6.5–8.1)

## 2022-10-18 LAB — URINALYSIS, ROUTINE W REFLEX MICROSCOPIC
Bacteria, UA: NONE SEEN
Bilirubin Urine: NEGATIVE
Glucose, UA: NEGATIVE mg/dL
Ketones, ur: 5 mg/dL — AB
Leukocytes,Ua: NEGATIVE
Nitrite: NEGATIVE
Protein, ur: 100 mg/dL — AB
Specific Gravity, Urine: 1.014 (ref 1.005–1.030)
pH: 6 (ref 5.0–8.0)

## 2022-10-18 LAB — CBC
HCT: 50.4 % (ref 39.0–52.0)
Hemoglobin: 16.5 g/dL (ref 13.0–17.0)
MCH: 30.4 pg (ref 26.0–34.0)
MCHC: 32.7 g/dL (ref 30.0–36.0)
MCV: 92.8 fL (ref 80.0–100.0)
Platelets: 113 10*3/uL — ABNORMAL LOW (ref 150–400)
RBC: 5.43 MIL/uL (ref 4.22–5.81)
RDW: 13.7 % (ref 11.5–15.5)
WBC: 7.3 10*3/uL (ref 4.0–10.5)
nRBC: 0 % (ref 0.0–0.2)

## 2022-10-18 LAB — RESP PANEL BY RT-PCR (RSV, FLU A&B, COVID)  RVPGX2
Influenza A by PCR: NEGATIVE
Influenza B by PCR: NEGATIVE
Resp Syncytial Virus by PCR: NEGATIVE
SARS Coronavirus 2 by RT PCR: NEGATIVE

## 2022-10-18 LAB — LIPASE, BLOOD: Lipase: 28 U/L (ref 11–51)

## 2022-10-18 MED ORDER — ONDANSETRON 4 MG PO TBDP
4.0000 mg | ORAL_TABLET | Freq: Once | ORAL | Status: AC | PRN
  Administered 2022-10-18: 4 mg via ORAL
  Filled 2022-10-18: qty 1

## 2022-10-18 MED ORDER — SODIUM CHLORIDE 0.9 % IV BOLUS
1000.0000 mL | Freq: Once | INTRAVENOUS | Status: AC
  Administered 2022-10-18: 1000 mL via INTRAVENOUS

## 2022-10-18 MED ORDER — HYDROCODONE-ACETAMINOPHEN 5-325 MG PO TABS: 1.0000 | ORAL_TABLET | ORAL | 0 refills | Status: DC | PRN

## 2022-10-18 MED ORDER — HYDROCODONE-ACETAMINOPHEN 5-325 MG PO TABS
2.0000 | ORAL_TABLET | Freq: Once | ORAL | Status: AC
  Administered 2022-10-18: 2 via ORAL
  Filled 2022-10-18: qty 2

## 2022-10-18 MED ORDER — IOHEXOL 300 MG/ML  SOLN
70.0000 mL | Freq: Once | INTRAMUSCULAR | Status: AC | PRN
  Administered 2022-10-18: 70 mL via INTRAVENOUS

## 2022-10-18 MED ORDER — ONDANSETRON 8 MG PO TBDP: 8.0000 mg | ORAL_TABLET | Freq: Three times a day (TID) | ORAL | 0 refills | Status: DC | PRN

## 2022-10-18 MED ORDER — ONDANSETRON HCL 4 MG/2ML IJ SOLN
4.0000 mg | Freq: Once | INTRAMUSCULAR | Status: AC
  Administered 2022-10-18: 4 mg via INTRAVENOUS
  Filled 2022-10-18: qty 2

## 2022-10-18 MED ORDER — DICYCLOMINE HCL 10 MG PO CAPS
10.0000 mg | ORAL_CAPSULE | Freq: Once | ORAL | Status: AC
  Administered 2022-10-18: 10 mg via ORAL
  Filled 2022-10-18: qty 1

## 2022-10-18 MED ORDER — DICYCLOMINE HCL 10 MG PO CAPS: 10.0000 mg | ORAL_CAPSULE | Freq: Three times a day (TID) | ORAL | 0 refills | Status: DC

## 2022-10-18 MED ORDER — KETOROLAC TROMETHAMINE 30 MG/ML IJ SOLN
15.0000 mg | Freq: Once | INTRAMUSCULAR | Status: AC
  Administered 2022-10-18: 15 mg via INTRAVENOUS
  Filled 2022-10-18: qty 1

## 2022-10-18 MED ORDER — KETOROLAC TROMETHAMINE 30 MG/ML IJ SOLN: 30.0000 mg | Freq: Once | INTRAMUSCULAR | Status: DC

## 2022-10-18 NOTE — ED Notes (Signed)
Pt to ct 

## 2022-10-18 NOTE — ED Triage Notes (Signed)
Pt repeatedly stating "I'm full of gas and need to have a blow out." When RN inquires as to description of symptom and why pt feels this way pt replies only " I know it's gas." N/v noted and pt reports onset sometime last night. Reports diarrhea and body aches as well. Pt also reports SOB

## 2022-10-18 NOTE — ED Triage Notes (Signed)
First Nurse Note:  BIB [facility]. EMS called to house for c/o generalized chest pain with n/v/d. Cough as well. N/v/d and cough x 1 day. Stated to EMS that cp onset after the other symptoms.   178/77 98% RA HR 60  RR 20 97.9 temp.   Family reported hx of gastric ulcers to EMS

## 2022-10-18 NOTE — ED Provider Notes (Signed)
[facility] Provider Note   Event Date/Time   First MD Initiated Contact with Patient 10/18/22 [id number]     (approximate) History  Abdominal Pain, Nausea, and Diarrhea  HPI Mark Jimenez is a 71 y.o. male with a stated past medical history of hypothyroidism and CKD who presents for intermittent but worsening right upper quadrant and midepigastric abdominal pain.  Patient states that this pain has been intermittent over the past few months and acutely worsening over the last 8 days.  Patient does describe nausea/vomiting after any p.o. intake over the last 8 days.  Patient also endorses associated diarrhea over this time as well.  Patient states that he also has known history of gastric ulcers for which he takes Tums and Pepto-Bismol.  Patient states that he has tried taking Pepto-Bismol for this pain and has vomited back up.  Patient endorses "gas pain" all over his abdomen as well that is not relieved with bowel movements or belching. ROS: Patient currently denies any vision changes, tinnitus, difficulty speaking, facial droop, sore throat, chest pain, shortness of breath, dysuria, or weakness/numbness/paresthesias in any extremity   Physical Exam  Triage Vital Signs: ED Triage Vitals  Enc Vitals Group     BP 10/18/22 0255 (!) 172/96     Pulse Rate 10/18/22 0255 65     Resp 10/18/22 0255 20     Temp 10/18/22 0255 97.8 F (36.6 C)     Temp Source 10/18/22 0255 Oral     SpO2 10/18/22 0255 99 %     Weight 10/18/22 0256 180 lb (81.6 kg)     Height 10/18/22 0256 6' (1.829 m)     Head Circumference --      Peak Flow --      Pain Score 10/18/22 0256 10     Pain Loc --      Pain Edu? --      Excl. in [place]? --    Most recent vital signs: Vitals:   10/18/22 0756 10/18/22 0900  BP: (!) 177/81 (!) 157/63  Pulse:  60  Resp: 20 19  Temp: 98 F (36.7 C)   SpO2: 98% 98%   General: Awake, oriented x4. CV:  Good peripheral perfusion.  Resp:  Normal effort.   Abd:  No distention.  Mild right upper quadrant tenderness to palpation Other:  Elderly the Caucasian male laying in bed in no acute distress ED Results / Procedures / Treatments  Labs (all labs ordered are listed, but only abnormal results are displayed) Labs Reviewed  COMPREHENSIVE METABOLIC PANEL - Abnormal; Notable for the following components:      Result Value   CO2 19 (*)    Glucose, Bld 150 (*)    BUN 24 (*)    Creatinine, Ser 2.11 (*)    AST 49 (*)    ALT 82 (*)    Alkaline Phosphatase 159 (*)    Total Bilirubin 1.5 (*)    GFR, Estimated 32 (*)    All other components within normal limits  CBC - Abnormal; Notable for the following components:   Platelets 113 (*)    All other components within normal limits  URINALYSIS, ROUTINE W REFLEX MICROSCOPIC - Abnormal; Notable for the following components:   Color, Urine YELLOW (*)    APPearance CLEAR (*)    Hgb urine dipstick MODERATE (*)    Ketones, ur 5 (*)    Protein, ur 100 (*)    All other components within normal  limits  RESP PANEL BY RT-PCR (RSV, FLU A&B, COVID)  [id number]  LIPASE, BLOOD   EKG ED ECG REPORT I, Naaman Plummer, the attending physician, personally viewed and interpreted this ECG. Date: 10/18/2022 EKG Time: 0310 Rate: 56 Rhythm: normal sinus rhythm QRS Axis: normal Intervals: normal ST/T Wave abnormalities: normal Narrative Interpretation: no evidence of acute ischemia RADIOLOGY ED MD interpretation: Ultrasound of the abdomen shows cholelithiasis without evidence of acute cholecystitis.  This imaging was interpreted by me  CT of the abdomen and pelvis with IV contrast shows cholelithiasis without evidence of acute cholecystitis.  This imaging was interpreted independently by me -Agree with radiology assessment Official radiology report(s): CT Abdomen Pelvis W Contrast  Result Date: 10/18/2022 CLINICAL DATA:  71 year old male with history of abdominal pain and chest pain with nausea, vomiting and  diarrhea. Cough. EXAM: CT ABDOMEN AND PELVIS WITH CONTRAST TECHNIQUE: Multidetector CT imaging of the abdomen and pelvis was performed using the standard protocol following bolus administration of intravenous contrast. RADIATION DOSE REDUCTION: This exam was performed according to the departmental dose-optimization program which includes automated exposure control, adjustment of the mA and/or kV according to patient size and/or use of iterative reconstruction technique. CONTRAST:  79mL OMNIPAQUE IOHEXOL 300 MG/ML  SOLN COMPARISON:  No priors. FINDINGS: Lower chest: Atherosclerotic calcifications in the left anterior descending and left circumflex coronary arteries. Severe calcifications of the mitral annulus. Moderate calcifications of the aortic valve. Hepatobiliary: No suspicious cystic or solid hepatic lesions. No intra or extrahepatic biliary ductal dilatation. Calcified gallstones measuring up to 1.4 cm lying dependently in the gallbladder. Gallbladder is moderately distended. Gallbladder wall thickness is normal. No pericholecystic fluid or surrounding inflammatory changes. Pancreas: No pancreatic mass. No pancreatic ductal dilatation. No pancreatic or peripancreatic fluid collections or inflammatory changes. Spleen: Unremarkable. Adrenals/Urinary Tract: Mild bilateral renal atrophy and multifocal cortical thinning (right-greater-than-left). Subcentimeter low-attenuation lesion in the lower pole of the left kidney, too small to characterize, but statistically likely a cyst (no imaging follow-up recommended). Bilateral adrenal glands are normal in appearance. No hydroureteronephrosis. Urinary bladder is partially obscured by beam hardening artifact from the patient's left hip arthroplasty, but visualized portions are unremarkable. Stomach/Bowel: The appearance of the stomach is normal. There is no pathologic dilatation of small bowel or colon. Normal appendix. Vascular/Lymphatic: Atherosclerotic calcifications  throughout the abdominal aorta and pelvic vasculature, without evidence of aneurysm or dissection. No lymphadenopathy noted in the abdomen or pelvis. Reproductive: Prostate gland and seminal vesicles are partially obscured by beam hardening artifact from the patient's left hip arthroplasty, but visualized portions are unremarkable. Other: No significant volume of ascites.  No pneumoperitoneum. Musculoskeletal: Mild chronic appearing compression fractures are noted at T12, L1, L2 and L3, most severe at L1 where there is up to 25% loss of central vertebral body height. Status post left hip arthroplasty. There are no aggressive appearing lytic or blastic lesions noted in the visualized portions of the skeleton. IMPRESSION: 1. No acute findings are noted in the abdomen or pelvis to account for the patient's symptoms. 2. Cholelithiasis without evidence of acute cholecystitis. 3. Aortic atherosclerosis, in addition to least 2 vessel coronary artery disease. Assessment for potential risk factor modification, dietary therapy or pharmacologic therapy may be warranted, if clinically indicated. 4. There are calcifications of the aortic valve and mitral annulus. Echocardiographic correlation for evaluation of potential valvular dysfunction may be warranted if clinically indicated. Electronically Signed   By: Vinnie Langton M.D.   On: 10/18/2022 08:56   US ABDOMEN LIMITED  RUQ (LIVER/GB)  Result Date: 10/18/2022 CLINICAL DATA:  71 year old male with history of elevated liver function tests. EXAM: ULTRASOUND ABDOMEN LIMITED RIGHT UPPER QUADRANT COMPARISON:  No priors. FINDINGS: Gallbladder: Multiple echogenic foci with posterior acoustic shadowing in the gallbladder, measuring up to 1.9 cm in diameter. Gallbladder is only moderately distended. Gallbladder wall thickness is normal at 3 mm. No pericholecystic fluid. Per report from the sonographer, the patient did not exhibit a sonographic Murphy's sign on examination. Common  bile duct: Diameter: 4 mm Liver: Limited study secondary to bowel gas and patient anatomy, resulting in incomplete visualization of the left lobe of the liver. With these limitations in mind no focal lesion is identified. Within normal limits in parenchymal echogenicity. Portal vein is patent on color Doppler imaging with normal direction of blood flow towards the liver. Other: None. IMPRESSION: 1. Cholelithiasis without evidence of acute cholecystitis. 2. Otherwise generally unremarkable although slightly limited examination. Electronically Signed   By: Vinnie Langton M.D.   On: 10/18/2022 07:27   PROCEDURES: Critical Care performed: No .1-3 Lead EKG Interpretation  Performed by: Naaman Plummer, MD Authorized by: Naaman Plummer, MD     Interpretation: normal     ECG rate:  62   ECG rate assessment: normal     Rhythm: sinus rhythm     Ectopy: none     Conduction: normal    MEDICATIONS ORDERED IN ED: Medications  dicyclomine (BENTYL) capsule 10 mg (has no administration in time range)  HYDROcodone-acetaminophen (NORCO/VICODIN) 5-325 MG per tablet 2 tablet (has no administration in time range)  ondansetron (ZOFRAN-ODT) disintegrating tablet 4 mg (4 mg Oral Given 10/18/22 0305)  sodium chloride 0.9 % bolus 1,000 mL (1,000 mLs Intravenous New Bag/Given 10/18/22 0813)  ondansetron (ZOFRAN) injection 4 mg (4 mg Intravenous Given 10/18/22 0812)  ketorolac (TORADOL) 30 MG/ML injection 15 mg (15 mg Intravenous Given 10/18/22 0815)  iohexol (OMNIPAQUE) 300 MG/ML solution 70 mL (70 mLs Intravenous Contrast Given 10/18/22 0822)   IMPRESSION / MDM / ASSESSMENT AND PLAN / ED COURSE  I reviewed the triage vital signs and the nursing notes.                             The patient is on the cardiac monitor to evaluate for evidence of arrhythmia and/or significant heart rate changes. Patient's presentation is most consistent with acute presentation with potential threat to life or bodily  function. Presentation most consistent with biliary colic, without evidence of infection. History and exam AAA, pancreatitis, SBO, appendicitis, mesenteric ischemia, nephrolithiasis, pyelonephritis, or diverticulitis.  ED Interventions: analgesia PRN ED Workup: CBC, BMP, LFTs, Lipase, Abdominal US Consults: I spoke to Dr. Hampton Abbot and general surgery who agrees to see this patient in clinic tomorrow Disposition: Refer to general surgery outpatient. Discharge home with SRP and instructions for general surgery follow up within 24-48 hours.   FINAL CLINICAL IMPRESSION(S) / ED DIAGNOSES   Final diagnoses:  Calculus of gallbladder without cholecystitis without obstruction  Abdominal pain, RUQ (right upper quadrant)   Rx / DC Orders   ED Discharge Orders          Ordered    dicyclomine (BENTYL) 10 MG capsule  3 times daily before meals & bedtime        10/18/22 1001    HYDROcodone-acetaminophen (NORCO) 5-325 MG tablet  Every 4 hours PRN        10/18/22 1001    ondansetron (ZOFRAN-ODT)  8 MG disintegrating tablet  Every 8 hours PRN        10/18/22 1001           Note:  This document was prepared using Dragon voice recognition software and may include unintentional dictation errors.   Naaman Plummer, MD 10/18/22 1046

## 2022-10-19 ENCOUNTER — Telehealth: Payer: Self-pay | Admitting: Surgery

## 2022-10-19 ENCOUNTER — Ambulatory Visit: Payer: Medicare HMO | Admitting: Surgery

## 2022-10-19 ENCOUNTER — Encounter: Payer: Self-pay | Admitting: Surgery

## 2022-10-19 ENCOUNTER — Telehealth: Payer: Self-pay

## 2022-10-19 VITALS — BP 129/62 | HR 75 | Temp 98.2°F | Ht 72.0 in | Wt 185.8 lb

## 2022-10-19 DIAGNOSIS — K8020 Calculus of gallbladder without cholecystitis without obstruction: Secondary | ICD-10-CM

## 2022-10-19 MED ORDER — OXYCODONE HCL 5 MG PO TABS: 5.0000 mg | ORAL_TABLET | Freq: Four times a day (QID) | ORAL | 0 refills | Status: AC | PRN

## 2022-10-19 MED ORDER — DICYCLOMINE HCL 10 MG PO CAPS: 10.0000 mg | ORAL_CAPSULE | Freq: Three times a day (TID) | ORAL | 0 refills | Status: DC

## 2022-10-19 NOTE — Progress Notes (Signed)
10/19/2022  Reason for Visit:  Symptomatic cholelithiasis  History of Present Illness: Mark Jimenez is a 71 y.o. male senting for evaluation of symptomatic cholelithiasis.  He was seen in the ED on 10/18/22 with RUQ and epigastric pain.  This had been ongoing intermittently over the past few months but over the past week has been worsening.  He is having associated nausea/vomiting with po intake.  Denies any fevers, chills, chest pain, shortness of breath.  He has a history of gastric ulcers.  In the ED, laboratory workup showed a WBC of 7.3, with mild changes in his LFTs with total bili of 1.5, AST 49, ALT 82, and Alk Phos 159.  U/S and CT scan were also done which showed cholelithiasis without any wall thickening or pericholecystic fluid.  His pain improved in the ED with medications and he was able to be discharged home with close follow up today.  Today, he reports that he's still having some pain, though not as severe.  The pain medication given by the ER was working to an extent.  He's unable to take NSAIDs due to CKD.  He has significant gout issues as well and is currently on a wheelchair.  Past Medical History: Past Medical History:  Diagnosis Date   Chronic tophaceous gout    GERD (gastroesophageal reflux disease)    Gout    History of kidney stones    Peripheral neuropathy    Wears dentures    partial upper     Past Surgical History: Past Surgical History:  Procedure Laterality Date   CATARACT EXTRACTION W/PHACO Left 01/06/2022   Procedure: CATARACT EXTRACTION PHACO AND INTRAOCULAR LENS PLACEMENT (IOC) LEFT 5.16 00:41.4;  Surgeon: Porfilio, William, MD;  Location: [facility];  Service: Ophthalmology;  Laterality: Left;   EYE SURGERY     HOLEP-LASER ENUCLEATION OF THE PROSTATE WITH MORCELLATION N/A 02/21/2021   Procedure: HOLEP-LASER ENUCLEATION OF THE PROSTATE WITH MORCELLATION;  Surgeon: Sninsky, Brian C, MD;  Location: [facility];  Service: Urology;  Laterality:  N/A;   TONSILLECTOMY     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 Authorizing Provider  acetaminophen (TYLENOL) 500 MG tablet Take 1,000 mg by mouth 2 (two) times daily as needed for moderate pain or mild pain. 01/07/21  Yes [provider]  calcium carbonate (TUMS - DOSED IN MG ELEMENTAL CALCIUM) 500 MG chewable tablet Chew 1 tablet (200 mg of elemental calcium total) by mouth daily. Patient taking differently: Chew 200 mg of elemental calcium by mouth daily as needed for indigestion or heartburn. 09/05/20  Yes Patel, Sona, MD  ferrous sulfate 325 (65 FE) MG tablet Take 325 mg by mouth in the morning and at bedtime. 01/28/21  Yes [provider]  magnesium oxide (MAG-OX) 400 MG tablet Take 400 mg by mouth daily. 01/28/21  Yes [provider]  Omeprazole 20 MG TBDD Take 20 mg by mouth 2 (two) times daily as needed.   Yes [provider]  oxyCODONE (ROXICODONE) 5 MG immediate release tablet Take 1 tablet (5 mg total) by mouth every 6 (six) hours as needed. 10/19/22 10/19/23 Yes Hibo Blasdell, MD  predniSONE (DELTASONE) 10 MG tablet Take 10 mg by mouth daily. 01/25/21  Yes   [provider]  Zinc Oxide (BOUDREAUXS BUTT PASTE) 16 % OINT Apply 1 application  topically daily as needed (rash). 01/25/21  Yes [provider]  dicyclomine (BENTYL) 10 MG capsule Take 1 capsule (10 mg total) by mouth 3 (three) times daily before meals for 15 days. 10/19/22 11/03/22  Azarie Coriz, MD    Allergies: Allergies  Allergen Reactions   Tape     Paper tape is okay. Use gauze to protect skin.   Allopurinol Nausea And Vomiting   Febuxostat Nausea And Vomiting    (Uloric)    Social History:   reports that he has quit smoking. His smokeless tobacco use includes chew. He reports that he does not currently use alcohol after a past usage of about 2.0 standard drinks of alcohol per week. He reports that he does not currently use drugs.   Family History: History reviewed. No pertinent family history.  Review of Systems: Review of Systems  Constitutional:  Negative for chills and fever.  HENT:  Negative for hearing loss.   Respiratory:  Negative for shortness of breath.   Cardiovascular:  Negative for chest pain.  Gastrointestinal:  Positive for abdominal pain, nausea and vomiting. Negative for constipation and diarrhea.  Genitourinary:  Negative for dysuria.  Musculoskeletal:  Negative for myalgias.  Skin:  Negative for rash.  Neurological:  Negative for dizziness.  Psychiatric/Behavioral:  Negative for depression.     Physical Exam BP 129/62   Pulse 75   Temp 98.2 F (36.8 C) (Oral)   Ht 6' (1.829 m)   Wt 185 lb 12.8 oz (84.3 kg)   SpO2 100%   BMI 25.20 kg/m  CONSTITUTIONAL: No acute distress, well nourished. HEENT:  Normocephalic, atraumatic, extraocular motion intact. NECK: Trachea is midline, and there is no jugular venous distension.  RESPIRATORY:  Normal respiratory effort without pathologic use of accessory muscles. CARDIOVASCULAR: Regular rhythm and rate GI: The abdomen is soft, non-distended, with some discomfort to palpation in the RUQ and epigastric areas, but negative Murphy's sign. MUSCULOSKELETAL:  Patient has multiple deformities over his fingers and elbow joints from gout deposits.  No peripheral edema. SKIN: Skin turgor is normal. There are no pathologic skin lesions.  NEUROLOGIC:  Motor and sensation is grossly normal.  Cranial nerves are grossly intact. PSYCH:  Alert and oriented to person, place and time. Affect is normal.  Laboratory Analysis: Labs from 10/18/2022: Sodium 138, potassium 3.9, chloride 107, CO2 19, BUN 24, creatinine 2.11.  Total  bilirubin 1.5, AST 49, ALT 82, alkaline phosphatase 159, lipase 28, albumin 4.2.  WBC 7.3, hemoglobin 16.5, hematocrit 50.4, platelets 113.  Imaging: Ultrasound RUQ on 10/18/2022: IMPRESSION: 1. Cholelithiasis without evidence of acute cholecystitis. 2. Otherwise generally unremarkable although slightly limited examination.  CT abdomen/pelvis on 10/18/2022: IMPRESSION: 1. No acute findings are noted in the abdomen or pelvis to account for the patient's symptoms. 2. Cholelithiasis without evidence of acute cholecystitis. 3. Aortic atherosclerosis, in addition to least 2 vessel coronary artery disease. Assessment for potential risk factor modification, dietary therapy or pharmacologic therapy may be warranted, if clinically indicated. 4. There are calcifications of the aortic valve and mitral annulus. Echocardiographic correlation for evaluation of potential valvular dysfunction may be warranted if clinically indicated.  Assessment and Plan: This is a 71 y.o. male with symptomatic cholelithiasis.  - Discussed with patient the findings on his imaging studies and laboratory workup.  Overall there is no evidence of inflammatory changes within the gallbladder and he was able to be discharged   home from the emergency room.  Patient is interested in surgical management to definitively treat his symptoms.  We discussed today the importance of maintaining a low-fat diet in order to trigger the gallbladder less.  And also discussed with him the role for robotic cholecystectomy in the management of his symptomatic cholelithiasis. - Reviewed discharge at length with him including the incisions, risks of bleeding, infection, injury to surrounding structures, that this would be an outpatient procedure, the use of ICG to better evaluate the biliary anatomy, postoperative activity restrictions, pain control, and he is willing to proceed. - Will give him a prescription for oxycodone to help him with pain control.   He really had a prescription for Vicodin from the emergency room but this is not helping him as much.  He also got a prescription for Bentyl. -We will schedule him for surgery on 11/03/2022.  Will also send for medical clearance to his PCP. - All of his questions have been answered  I spent 60 minutes dedicated to the care of this patient on the date of this encounter to include pre-visit review of records, face-to-face time with the patient discussing diagnosis and management, and any post-visit coordination of care.   Mark Dascenzo Luis Godwin Tedesco, MD [facility]    

## 2022-10-19 NOTE — Patient Instructions (Addendum)
Our surgery scheduler Pamala Hurry will call you within 24-48 hours to get you scheduled. If you have not heard from her after 48 hours, please call our office. Have the blue sheet available when she calls to write down important information.   If you have any concerns or questions, please feel free to call our office.   Minimally Invasive Cholecystectomy Minimally invasive cholecystectomy is surgery to remove the gallbladder. The gallbladder is a pear-shaped organ that lies beneath the liver on the right side of the body. The gallbladder stores bile, which is a fluid that helps the body digest fats. Cholecystectomy is often done to treat inflammation (irritation and swelling) of the gallbladder (cholecystitis). This condition is usually caused by a buildup of gallstones (cholelithiasis) in the gallbladder or when the fluid in the gall bladder becomes stagnant because gallstones get stuck in the ducts (tubes) and block the flow of bile. This can result in inflammation and pain. In severe cases, emergency surgery may be required. This procedure is done through small incisions in the abdomen, instead of one large incision. It is also called laparoscopic surgery. A thin scope with a camera (laparoscope) is inserted through one incision. Then surgical instruments are inserted through the other incisions. In some cases, a minimally invasive surgery may need to be changed to a surgery that is done through a larger incision. This is called open surgery. Tell a health care provider about: Any allergies you have. All medicines you are taking, including vitamins, herbs, eye drops, creams, and over-the-counter medicines. Any problems you or family members have had with anesthetic medicines. Any bleeding problems you have. Any surgeries you have had. Any medical conditions you have. Whether you are pregnant or may be pregnant. What are the risks? Generally, this is a safe procedure. However, problems may occur,  including: Infection. Bleeding. Allergic reactions to medicines. Damage to nearby structures or organs. A gallstone remaining in the common bile duct. The common bile duct carries bile from the gallbladder to the small intestine. A bile leak from the liver or cystic duct after your gallbladder is removed. What happens before the procedure? When to stop eating and drinking Follow instructions from your health care provider about what you may eat and drink before your procedure. These may include: 8 hours before the procedure Stop eating most foods. Do not eat meat, fried foods, or fatty foods. Eat only light foods, such as toast or crackers. All liquids are okay except energy drinks and alcohol. 6 hours before the procedure Stop eating. Drink only clear liquids, such as water, clear fruit juice, black coffee, plain tea, and sports drinks. Do not drink energy drinks or alcohol. 2 hours before the procedure Stop drinking all liquids. You may be allowed to take medicines with small sips of water. If you do not follow your health care provider's instructions, your procedure may be delayed or canceled. Medicines Ask your health care provider about: Changing or stopping your regular medicines. This is especially important if you are taking diabetes medicines or blood thinners. Taking medicines such as aspirin and ibuprofen. These medicines can thin your blood. Do not take these medicines unless your health care provider tells you to take them. Taking over-the-counter medicines, vitamins, herbs, and supplements. General instructions If you will be going home right after the procedure, plan to have a responsible adult: Take you home from the hospital or clinic. You will not be allowed to drive. Care for you for the time you are told. Do  not use any products that contain nicotine or tobacco for at least 4 weeks before the procedure. These products include cigarettes, chewing tobacco, and vaping  devices, such as e-cigarettes. If you need help quitting, ask your health care provider. Ask your health care provider: How your surgery site will be marked. What steps will be taken to help prevent infection. These may include: Removing hair at the surgery site. Washing skin with a germ-killing soap. Taking antibiotic medicine. What happens during the procedure?  An IV will be inserted into one of your veins. You will be given one or both of the following: A medicine to help you relax (sedative). A medicine to make you fall asleep (general anesthetic). Your surgeon will make several small incisions in your abdomen. The laparoscope will be inserted through one of the small incisions. The camera on the laparoscope will send images to a monitor in the operating room. This lets your surgeon see inside your abdomen. A gas will be pumped into your abdomen. This will expand your abdomen to give the surgeon more room to perform the surgery. Other tools that are needed for the procedure will be inserted through the other incisions. The gallbladder will be removed through one of the incisions. Your common bile duct may be examined. If stones are found in the common bile duct, they may be removed. After your gallbladder has been removed, the incisions will be closed with stitches (sutures), staples, or skin glue. Your incisions will be covered with a bandage (dressing). The procedure may vary among health care providers and hospitals. What happens after the procedure? Your blood pressure, heart rate, breathing rate, and blood oxygen level will be monitored until you leave the hospital or clinic. You will be given medicines as needed to control your pain. You may have a drain placed in the incision. The drain will be removed a day or two after the procedure. Summary Minimally invasive cholecystectomy, also called laparoscopic cholecystectomy, is surgery to remove the gallbladder using small  incisions. Tell your health care provider about all the medical conditions you have and all the medicines you are taking for those conditions. Before the procedure, follow instructions about when to stop eating and drinking and changing or stopping medicines. Plan to have a responsible adult care for you for the time you are told after you leave the hospital or clinic. This information is not intended to replace advice given to you by your health care provider. Make sure you discuss any questions you have with your health care provider.  Gallbladder Eating Plan High blood cholesterol, obesity, a sedentary lifestyle, an unhealthy diet, and diabetes are risk factors for developing gallstones. If you have a gallbladder condition, you may have trouble digesting fats and tolerating high fat intake. Eating a low-fat diet can help reduce your symptoms and may be helpful before and after having surgery to remove your gallbladder (cholecystectomy). Your health care provider may recommend that you work with a dietitian to help you reduce the amount of fat in your diet. What are tips for following this plan? General guidelines Limit your fat intake to less than 30% of your total daily calories. If you eat around 1,800 calories each day, this means eating less than 60 grams (g) of fat per day. Fat is an important part of a healthy diet. Eating a low-fat diet can make it hard to maintain a healthy body weight. Ask your dietitian how much fat, calories, and other nutrients you need each day.  Eat small, frequent meals throughout the day instead of three large meals. Drink at least 8-10 cups (1.9-2.4 L) of fluid a day. Drink enough fluid to keep your urine pale yellow. If you drink alcohol: Limit how much you have to: 0-1 drink a day for women who are not pregnant. 0-2 drinks a day for men. Know how much alcohol is in a drink. In the [place] one drink equals one 12 oz bottle of beer (355 mL), one 5 oz glass of wine  (148 mL), or one 1 oz glass of hard liquor (44 mL). Reading food labels  Check nutrition facts on food labels for the amount of fat per serving. Choose foods with less than 3 grams of fat per serving. Shopping Choose nonfat and low-fat healthy foods. Look for the words "nonfat," "low-fat," or "fat-free." Avoid buying processed or prepackaged foods. Cooking Cook using low-fat methods, such as baking, broiling, grilling, or boiling. Cook with small amounts of healthy fats, such as olive oil, grapeseed oil, canola oil, avocado oil, or sunflower oil. What foods are recommended?  All fresh, frozen, or canned fruits and vegetables. Whole grains. Low-fat or nonfat (skim) milk and yogurt. Lean meat, skinless poultry, fish, eggs, and beans. Low-fat protein supplement powders or drinks. Spices and herbs. The items listed above may not be a complete list of foods and beverages you can eat and drink. Contact a dietitian for more information. What foods are not recommended? High-fat foods. These include baked goods, fast food, fatty cuts of meat, ice cream, french toast, sweet rolls, pizza, cheese bread, foods covered with butter, creamy sauces, or cheese. Fried foods. These include french fries, tempura, battered fish, breaded chicken, fried breads, and sweets. Foods that cause bloating and gas. The items listed above may not be a complete list of foods that you should avoid. Contact a dietitian for more information. Summary A low-fat diet can be helpful if you have a gallbladder condition, or before and after gallbladder surgery. Limit your fat intake to less than 30% of your total daily calories. This is about 60 g of fat if you eat 1,800 calories each day. Eat small, frequent meals throughout the day instead of three large meals. This information is not intended to replace advice given to you by your health care provider. Make sure you discuss any questions you have with your health care  provider. Document Revised: 10/03/2021 Document Reviewed: 10/03/2021 Elsevier Patient Education  [place].

## 2022-10-19 NOTE — Telephone Encounter (Signed)
Patient has been advised of Pre-Admission date/time, and Surgery date at [facility].  Surgery Date: 11/03/22  Preadmission Testing Date: 10/28/22 (phone 8a-1p)  Patient has been made aware to call [phone], between 1-3:00pm the day before surgery, to find out what time to arrive for surgery.

## 2022-10-19 NOTE — H&P (View-Only) (Signed)
10/19/2022  Reason for Visit:  Symptomatic cholelithiasis  History of Present Illness: Mark Jimenez is a 71 y.o. male senting for evaluation of symptomatic cholelithiasis.  He was seen in the ED on 10/18/22 with RUQ and epigastric pain.  This had been ongoing intermittently over the past few months but over the past week has been worsening.  He is having associated nausea/vomiting with po intake.  Denies any fevers, chills, chest pain, shortness of breath.  He has a history of gastric ulcers.  In the ED, laboratory workup showed a WBC of 7.3, with mild changes in his LFTs with total bili of 1.5, AST 49, ALT 82, and Alk Phos 159.  U/S and CT scan were also done which showed cholelithiasis without any wall thickening or pericholecystic fluid.  His pain improved in the ED with medications and he was able to be discharged home with close follow up today.  Today, he reports that he's still having some pain, though not as severe.  The pain medication given by the ER was working to an extent.  He's unable to take NSAIDs due to CKD.  He has significant gout issues as well and is currently on a wheelchair.  Past Medical History: Past Medical History:  Diagnosis Date   Chronic tophaceous gout    GERD (gastroesophageal reflux disease)    Gout    History of kidney stones    Peripheral neuropathy    Wears dentures    partial upper     Past Surgical History: Past Surgical History:  Procedure Laterality Date   CATARACT EXTRACTION W/PHACO Left 01/06/2022   Procedure: CATARACT EXTRACTION PHACO AND INTRAOCULAR LENS PLACEMENT (IOC) LEFT 5.16 00:41.4;  Surgeon: Birder Robson, MD;  Location: [facility];  Service: Ophthalmology;  Laterality: Left;   EYE SURGERY     HOLEP-LASER ENUCLEATION OF THE PROSTATE WITH MORCELLATION N/A 02/21/2021   Procedure: HOLEP-LASER ENUCLEATION OF THE PROSTATE WITH MORCELLATION;  Surgeon: Billey Co, MD;  Location: [facility];  Service: Urology;  Laterality:  N/A;   TONSILLECTOMY     TOTAL HIP ARTHROPLASTY Left 08/31/2020   Procedure: TOTAL HIP ARTHROPLASTY ANTERIOR APPROACH;  Surgeon: Hessie Knows, MD;  Location: [facility];  Service: Orthopedics;  Laterality: Left;   VARICOSE VEIN SURGERY Right     Home Medications: Prior to Admission medications   Medication Sig Start Date End Date Taking? Authorizing Provider  acetaminophen (TYLENOL) 500 MG tablet Take 1,000 mg by mouth 2 (two) times daily as needed for moderate pain or mild pain. 01/07/21  Yes [provider]  calcium carbonate (TUMS - DOSED IN MG ELEMENTAL CALCIUM) 500 MG chewable tablet Chew 1 tablet (200 mg of elemental calcium total) by mouth daily. Patient taking differently: Chew 200 mg of elemental calcium by mouth daily as needed for indigestion or heartburn. 09/05/20  Yes Fritzi Mandes, MD  ferrous sulfate 325 (65 FE) MG tablet Take 325 mg by mouth in the morning and at bedtime. 01/28/21  Yes [provider]  magnesium oxide (MAG-OX) 400 MG tablet Take 400 mg by mouth daily. 01/28/21  Yes [provider]  Omeprazole 20 MG TBDD Take 20 mg by mouth 2 (two) times daily as needed.   Yes [provider]  oxyCODONE (ROXICODONE) 5 MG immediate release tablet Take 1 tablet (5 mg total) by mouth every 6 (six) hours as needed. 10/19/22 10/19/23 Yes Kaylub Detienne, Jacqulyn Bath, MD  predniSONE (DELTASONE) 10 MG tablet Take 10 mg by mouth daily. 01/25/21  Yes  [provider]  Zinc Oxide (BOUDREAUXS BUTT PASTE) 16 % OINT Apply 1 application  topically daily as needed (rash). 01/25/21  Yes [provider]  dicyclomine (BENTYL) 10 MG capsule Take 1 capsule (10 mg total) by mouth 3 (three) times daily before meals for 15 days. 10/19/22 11/03/22  Olean Ree, MD    Allergies: Allergies  Allergen Reactions   Tape     Paper tape is okay. Use gauze to protect skin.   Allopurinol Nausea And Vomiting   Febuxostat Nausea And Vomiting    ([place])    Social History:   reports that he has quit smoking. His smokeless tobacco use includes chew. He reports that he does not currently use alcohol after a past usage of about 2.0 standard drinks of alcohol per week. He reports that he does not currently use drugs.   Family History: History reviewed. No pertinent family history.  Review of Systems: Review of Systems  Constitutional:  Negative for chills and fever.  HENT:  Negative for hearing loss.   Respiratory:  Negative for shortness of breath.   Cardiovascular:  Negative for chest pain.  Gastrointestinal:  Positive for abdominal pain, nausea and vomiting. Negative for constipation and diarrhea.  Genitourinary:  Negative for dysuria.  Musculoskeletal:  Negative for myalgias.  Skin:  Negative for rash.  Neurological:  Negative for dizziness.  Psychiatric/Behavioral:  Negative for depression.     Physical Exam BP 129/62   Pulse 75   Temp 98.2 F (36.8 C) (Oral)   Ht 6' (1.829 m)   Wt 185 lb 12.8 oz (84.3 kg)   SpO2 100%   BMI 25.20 kg/m  CONSTITUTIONAL: No acute distress, well nourished. HEENT:  Normocephalic, atraumatic, extraocular motion intact. NECK: Trachea is midline, and there is no jugular venous distension.  RESPIRATORY:  Normal respiratory effort without pathologic use of accessory muscles. CARDIOVASCULAR: Regular rhythm and rate GI: The abdomen is soft, non-distended, with some discomfort to palpation in the RUQ and epigastric areas, but negative Murphy's sign. MUSCULOSKELETAL:  Patient has multiple deformities over his fingers and elbow joints from gout deposits.  No peripheral edema. SKIN: Skin turgor is normal. There are no pathologic skin lesions.  NEUROLOGIC:  Motor and sensation is grossly normal.  Cranial nerves are grossly intact. PSYCH:  Alert and oriented to person, place and time. Affect is normal.  Laboratory Analysis: Labs from 10/18/2022: Sodium 138, potassium 3.9, chloride 107, CO2 19, BUN 24, creatinine 2.11.  Total  bilirubin 1.5, AST 49, ALT 82, alkaline phosphatase 159, lipase 28, albumin 4.2.  WBC 7.3, hemoglobin 16.5, hematocrit 50.4, platelets 113.  Imaging: Ultrasound RUQ on 10/18/2022: IMPRESSION: 1. Cholelithiasis without evidence of acute cholecystitis. 2. Otherwise generally unremarkable although slightly limited examination.  CT abdomen/pelvis on 10/18/2022: IMPRESSION: 1. No acute findings are noted in the abdomen or pelvis to account for the patient's symptoms. 2. Cholelithiasis without evidence of acute cholecystitis. 3. Aortic atherosclerosis, in addition to least 2 vessel coronary artery disease. Assessment for potential risk factor modification, dietary therapy or pharmacologic therapy may be warranted, if clinically indicated. 4. There are calcifications of the aortic valve and mitral annulus. Echocardiographic correlation for evaluation of potential valvular dysfunction may be warranted if clinically indicated.  Assessment and Plan: This is a 71 y.o. male with symptomatic cholelithiasis.  - Discussed with patient the findings on his imaging studies and laboratory workup.  Overall there is no evidence of inflammatory changes within the gallbladder and he was able to be discharged  home from the emergency room.  Patient is interested in surgical management to definitively treat his symptoms.  We discussed today the importance of maintaining a low-fat diet in order to trigger the gallbladder less.  And also discussed with him the role for robotic cholecystectomy in the management of his symptomatic cholelithiasis. - Reviewed discharge at length with him including the incisions, risks of bleeding, infection, injury to surrounding structures, that this would be an outpatient procedure, the use of ICG to better evaluate the biliary anatomy, postoperative activity restrictions, pain control, and he is willing to proceed. - Will give him a prescription for oxycodone to help him with pain control.   He really had a prescription for Vicodin from the emergency room but this is not helping him as much.  He also got a prescription for Bentyl. -We will schedule him for surgery on 11/03/2022.  Will also send for medical clearance to his PCP. - All of his questions have been answered  I spent 60 minutes dedicated to the care of this patient on the date of this encounter to include pre-visit review of records, face-to-face time with the patient discussing diagnosis and management, and any post-visit coordination of care.   Melvyn Neth, [facility]

## 2022-10-19 NOTE — Telephone Encounter (Signed)
Faxed medical clearance to [facility] at [phone].

## 2022-10-22 ENCOUNTER — Telehealth: Payer: Self-pay

## 2022-10-22 NOTE — Progress Notes (Unsigned)
Medical Clearance has been received from Ellis Parents, NP. The patient is cleared at Medium risk for surgery. Labs prior to surgery are suggested and have already been ordered.

## 2022-10-22 NOTE — Telephone Encounter (Signed)
     Patient  visit on 12/17  at [place]   Have you been able to follow up with your primary care physician? Yes   The patient was or was not able to obtain any needed medicine or equipment. Yes   Are there diet recommendations that you are having difficulty following? Na    Patient expresses understanding of discharge instructions and education provided has no other needs at this time.  Yes      [place], [facility], Care Management  [phone] [address], [place], [place] [address] Phone: [phone] Email: [email or web address]

## 2022-10-28 ENCOUNTER — Encounter
Admission: RE | Admit: 2022-10-28 | Discharge: 2022-10-28 | Disposition: A | Discharge status: Home / Self Care | Payer: Medicare HMO | Source: Ambulatory Visit | Attending: Surgery | Admitting: Surgery

## 2022-10-28 ENCOUNTER — Other Ambulatory Visit: Payer: Self-pay

## 2022-10-28 HISTORY — DX: Anemia, unspecified: D64.9

## 2022-10-28 HISTORY — DX: Chronic kidney disease, unspecified: N18.9

## 2022-10-28 NOTE — Patient Instructions (Addendum)
Your procedure is scheduled on: 11/03/21 - Tuesday Report to the Registration Desk on the 1st floor of the [facility]. To find out your arrival time, please call [phone] between 1PM - 3PM on: 10/30/22 - Friday If your arrival time is 6:00 am, do not arrive prior to that time as the [facility] entrance doors do not open until 6:00 am.  REMEMBER: Instructions that are not followed completely may result in serious medical risk, up to and including death; or upon the discretion of your surgeon and anesthesiologist your surgery may need to be rescheduled.  Do not eat food after midnight the night before surgery.  No gum chewing, lozengers or hard candies.  You may however, drink CLEAR liquids up to 2 hours before you are scheduled to arrive for your surgery. Do not drink anything within 2 hours of your scheduled arrival time.  Clear liquids include: - water  - apple juice without pulp - gatorade (not RED colors) - black coffee or tea (Do NOT add milk or creamers to the coffee or tea) Do NOT drink anything that is not on this list.  TAKE THESE MEDICATIONS THE MORNING OF SURGERY WITH A SIP OF WATER:  - oxyCODONE (ROXICODONE) if needed. - Omeprazole - (take one the night before and one on the morning of surgery - helps to prevent nausea after surgery.)  One week prior to surgery: Stop Anti-inflammatories (NSAIDS) such as Advil, Aleve, Ibuprofen, Motrin, Naproxen, Naprosyn and Aspirin based products such as Excedrin, Goodys Powder, BC Powder.  Stop ANY OVER THE COUNTER supplements until after surgery.  You may however, continue to take Tylenol if needed for pain up until the day of surgery.  No Alcohol for 24 hours before or after surgery.  No Smoking including e-cigarettes for 24 hours prior to surgery.  No chewable tobacco products for at least 6 hours prior to surgery.  No nicotine patches on the day of surgery.  Do not use any "recreational" drugs for at least a week prior  to your surgery.  Please be advised that the combination of cocaine and anesthesia may have negative outcomes, up to and including death. If you test positive for cocaine, your surgery will be cancelled.  On the morning of surgery brush your teeth with toothpaste and water, you may rinse your mouth with mouthwash if you wish. Do not swallow any toothpaste or mouthwash.  Use CHG Soap or wipes as directed on instruction sheet.  Do not wear jewelry, make-up, hairpins, clips or nail polish.  Do not wear lotions, powders, or perfumes.   Do not shave body from the neck down 48 hours prior to surgery just in case you cut yourself which could leave a site for infection.  Also, freshly shaved skin may become irritated if using the CHG soap.  Contact lenses, hearing aids and dentures may not be worn into surgery.  Do not bring valuables to the hospital. [facility] is not responsible for any missing/lost belongings or valuables.   Notify your doctor if there is any change in your medical condition (cold, fever, infection).  Wear comfortable clothing (specific to your surgery type) to the hospital.  After surgery, you can help prevent lung complications by doing breathing exercises.  Take deep breaths and cough every 1-2 hours. Your doctor may order a device called an Incentive Spirometer to help you take deep breaths. When coughing or sneezing, hold a pillow firmly against your incision with both hands. This is called "splinting."  Doing this helps protect your incision. It also decreases belly discomfort.  If you are being admitted to the hospital overnight, leave your suitcase in the car. After surgery it may be brought to your room.  If you are being discharged the day of surgery, you will not be allowed to drive home. You will need a responsible adult (18 years or older) to drive you home and stay with you that night.   If you are taking public transportation, you will need to have a  responsible adult (18 years or older) with you. Please confirm with your physician that it is acceptable to use public transportation.   Please call the [place] [facility] Dept. at [phone] if you have any questions about these instructions.  Surgery Visitation Policy:  Patients undergoing a surgery or procedure may have two family members or support persons with them as long as the person is not COVID-19 positive or experiencing its symptoms.   Inpatient Visitation:    Visiting hours are 7 a.m. to 8 p.m. Up to four visitors are allowed at one time in a patient room. The visitors may rotate out with other people during the day. One designated support person (adult) may remain overnight.  Due to an increase in RSV and influenza rates and associated hospitalizations, children ages 74 and under will not be able to visit patients in [facility]. Masks continue to be strongly recommended.

## 2022-10-29 DIAGNOSIS — N1832 Chronic kidney disease, stage 3b: Secondary | ICD-10-CM | POA: Diagnosis not present

## 2022-10-29 DIAGNOSIS — Z01818 Encounter for other preprocedural examination: Secondary | ICD-10-CM | POA: Diagnosis not present

## 2022-11-02 MED ORDER — INDOCYANINE GREEN 25 MG IV SOLR
2.5000 mg | INTRAVENOUS | Status: AC
  Administered 2022-11-03: 2.5 mg via INTRAVENOUS
  Filled 2022-11-02: qty 1
  Filled 2022-11-02: qty 10

## 2022-11-02 MED ORDER — ORAL CARE MOUTH RINSE: 15.0000 mL | Freq: Once | OROMUCOSAL | Status: AC

## 2022-11-02 MED ORDER — ACETAMINOPHEN 500 MG PO TABS: 1000.0000 mg | ORAL_TABLET | ORAL | Status: AC

## 2022-11-02 MED ORDER — CHLORHEXIDINE GLUCONATE CLOTH 2 % EX PADS
6.0000 | MEDICATED_PAD | Freq: Once | CUTANEOUS | Status: AC
  Administered 2022-11-03: 6 via TOPICAL

## 2022-11-02 MED ORDER — BUPIVACAINE LIPOSOME 1.3 % IJ SUSP: 20.0000 mL | Freq: Once | INTRAMUSCULAR | Status: DC

## 2022-11-02 MED ORDER — GABAPENTIN 300 MG PO CAPS: 300.0000 mg | ORAL_CAPSULE | ORAL | Status: AC

## 2022-11-02 MED ORDER — CHLORHEXIDINE GLUCONATE CLOTH 2 % EX PADS: 6.0000 | MEDICATED_PAD | Freq: Once | CUTANEOUS | Status: DC

## 2022-11-02 MED ORDER — CEFAZOLIN SODIUM-DEXTROSE 2-4 GM/100ML-% IV SOLN
2.0000 g | INTRAVENOUS | Status: AC
  Administered 2022-11-03: 2 g via INTRAVENOUS

## 2022-11-02 MED ORDER — LACTATED RINGERS IV SOLN: INTRAVENOUS | Status: DC

## 2022-11-02 MED ORDER — CHLORHEXIDINE GLUCONATE 0.12 % MT SOLN: 15.0000 mL | Freq: Once | OROMUCOSAL | Status: AC

## 2022-11-03 ENCOUNTER — Encounter
Admission: RE | Disposition: A | Discharge status: Home / Self Care | Payer: Self-pay | Source: Home / Self Care | Attending: Surgery

## 2022-11-03 ENCOUNTER — Other Ambulatory Visit: Payer: Self-pay

## 2022-11-03 ENCOUNTER — Ambulatory Visit
Admission: RE | Admit: 2022-11-03 | Discharge: 2022-11-03 | Disposition: A | Discharge status: Home / Self Care | Payer: Medicare HMO | Attending: Surgery | Admitting: Surgery

## 2022-11-03 ENCOUNTER — Encounter: Payer: Self-pay | Admitting: Surgery

## 2022-11-03 ENCOUNTER — Ambulatory Visit: Payer: Medicare HMO | Admitting: Anesthesiology

## 2022-11-03 DIAGNOSIS — N189 Chronic kidney disease, unspecified: Secondary | ICD-10-CM | POA: Insufficient documentation

## 2022-11-03 DIAGNOSIS — K8000 Calculus of gallbladder with acute cholecystitis without obstruction: Secondary | ICD-10-CM | POA: Insufficient documentation

## 2022-11-03 DIAGNOSIS — K8020 Calculus of gallbladder without cholecystitis without obstruction: Secondary | ICD-10-CM | POA: Diagnosis not present

## 2022-11-03 DIAGNOSIS — K219 Gastro-esophageal reflux disease without esophagitis: Secondary | ICD-10-CM | POA: Diagnosis not present

## 2022-11-03 DIAGNOSIS — Z8711 Personal history of peptic ulcer disease: Secondary | ICD-10-CM | POA: Insufficient documentation

## 2022-11-03 DIAGNOSIS — G629 Polyneuropathy, unspecified: Secondary | ICD-10-CM | POA: Insufficient documentation

## 2022-11-03 DIAGNOSIS — Z87891 Personal history of nicotine dependence: Secondary | ICD-10-CM | POA: Diagnosis not present

## 2022-11-03 DIAGNOSIS — K810 Acute cholecystitis: Secondary | ICD-10-CM | POA: Diagnosis not present

## 2022-11-03 DIAGNOSIS — Z87442 Personal history of urinary calculi: Secondary | ICD-10-CM | POA: Diagnosis not present

## 2022-11-03 DIAGNOSIS — K8010 Calculus of gallbladder with chronic cholecystitis without obstruction: Secondary | ICD-10-CM | POA: Diagnosis not present

## 2022-11-03 DIAGNOSIS — R69 Illness, unspecified: Secondary | ICD-10-CM | POA: Diagnosis not present

## 2022-11-03 DIAGNOSIS — Z96642 Presence of left artificial hip joint: Secondary | ICD-10-CM | POA: Insufficient documentation

## 2022-11-03 DIAGNOSIS — Z993 Dependence on wheelchair: Secondary | ICD-10-CM | POA: Diagnosis not present

## 2022-11-03 DIAGNOSIS — E039 Hypothyroidism, unspecified: Secondary | ICD-10-CM | POA: Diagnosis not present

## 2022-11-03 SURGERY — CHOLECYSTECTOMY, ROBOT-ASSISTED, LAPAROSCOPIC
Anesthesia: General

## 2022-11-03 MED ORDER — 0.9 % SODIUM CHLORIDE (POUR BTL) OPTIME
TOPICAL | Status: DC | PRN
  Administered 2022-11-03: 150 mL

## 2022-11-03 MED ORDER — CHLORHEXIDINE GLUCONATE 0.12 % MT SOLN
OROMUCOSAL | Status: AC
  Administered 2022-11-03: 15 mL via OROMUCOSAL
  Filled 2022-11-03: qty 15

## 2022-11-03 MED ORDER — PHENYLEPHRINE 80 MCG/ML (10ML) SYRINGE FOR IV PUSH (FOR BLOOD PRESSURE SUPPORT)
PREFILLED_SYRINGE | INTRAVENOUS | Status: DC | PRN
  Administered 2022-11-03: 80 ug via INTRAVENOUS

## 2022-11-03 MED ORDER — OXYCODONE HCL 5 MG PO TABS: 5.0000 mg | ORAL_TABLET | ORAL | 0 refills | Status: DC | PRN

## 2022-11-03 MED ORDER — ACETAMINOPHEN 500 MG PO TABS
ORAL_TABLET | ORAL | Status: AC
  Administered 2022-11-03: 1000 mg via ORAL
  Filled 2022-11-03: qty 2

## 2022-11-03 MED ORDER — OXYCODONE HCL 5 MG PO TABS
5.0000 mg | ORAL_TABLET | Freq: Once | ORAL | Status: AC
  Administered 2022-11-03: 5 mg via ORAL

## 2022-11-03 MED ORDER — HYDROMORPHONE HCL 1 MG/ML IJ SOLN
INTRAMUSCULAR | Status: AC
  Filled 2022-11-03: qty 1

## 2022-11-03 MED ORDER — CEFAZOLIN SODIUM-DEXTROSE 2-4 GM/100ML-% IV SOLN
INTRAVENOUS | Status: AC
  Filled 2022-11-03: qty 100

## 2022-11-03 MED ORDER — BUPIVACAINE-EPINEPHRINE (PF) 0.5% -1:200000 IJ SOLN
INTRAMUSCULAR | Status: DC | PRN
  Administered 2022-11-03: 40 mL

## 2022-11-03 MED ORDER — BUPIVACAINE-EPINEPHRINE (PF) 0.5% -1:200000 IJ SOLN
INTRAMUSCULAR | Status: AC
  Filled 2022-11-03: qty 30

## 2022-11-03 MED ORDER — PHENYLEPHRINE 80 MCG/ML (10ML) SYRINGE FOR IV PUSH (FOR BLOOD PRESSURE SUPPORT)
PREFILLED_SYRINGE | INTRAVENOUS | Status: AC
  Filled 2022-11-03: qty 10

## 2022-11-03 MED ORDER — FENTANYL CITRATE (PF) 100 MCG/2ML IJ SOLN
INTRAMUSCULAR | Status: DC | PRN
  Administered 2022-11-03: 25 ug via INTRAVENOUS
  Administered 2022-11-03: 50 ug via INTRAVENOUS
  Administered 2022-11-03: 25 ug via INTRAVENOUS

## 2022-11-03 MED ORDER — FENTANYL CITRATE (PF) 100 MCG/2ML IJ SOLN
INTRAMUSCULAR | Status: AC
  Administered 2022-11-03: 25 ug via INTRAVENOUS
  Filled 2022-11-03: qty 2

## 2022-11-03 MED ORDER — ESMOLOL HCL 100 MG/10ML IV SOLN
INTRAVENOUS | Status: DC | PRN
  Administered 2022-11-03: 20 mg via INTRAVENOUS

## 2022-11-03 MED ORDER — EPHEDRINE 5 MG/ML INJ
INTRAVENOUS | Status: AC
  Filled 2022-11-03: qty 5

## 2022-11-03 MED ORDER — BUPIVACAINE LIPOSOME 1.3 % IJ SUSP
INTRAMUSCULAR | Status: AC
  Filled 2022-11-03: qty 10

## 2022-11-03 MED ORDER — FENTANYL CITRATE (PF) 100 MCG/2ML IJ SOLN
INTRAMUSCULAR | Status: AC
  Filled 2022-11-03: qty 2

## 2022-11-03 MED ORDER — DEXAMETHASONE SODIUM PHOSPHATE 10 MG/ML IJ SOLN
INTRAMUSCULAR | Status: AC
  Filled 2022-11-03: qty 1

## 2022-11-03 MED ORDER — ROCURONIUM BROMIDE 10 MG/ML (PF) SYRINGE
PREFILLED_SYRINGE | INTRAVENOUS | Status: AC
  Filled 2022-11-03: qty 10

## 2022-11-03 MED ORDER — PROPOFOL 10 MG/ML IV BOLUS
INTRAVENOUS | Status: AC
  Filled 2022-11-03: qty 20

## 2022-11-03 MED ORDER — OXYCODONE HCL 5 MG PO TABS
ORAL_TABLET | ORAL | Status: AC
  Filled 2022-11-03: qty 1

## 2022-11-03 MED ORDER — ACETAMINOPHEN 500 MG PO TABS: 1000.0000 mg | ORAL_TABLET | Freq: Four times a day (QID) | ORAL | Status: DC | PRN

## 2022-11-03 MED ORDER — GLYCOPYRROLATE 0.2 MG/ML IJ SOLN
INTRAMUSCULAR | Status: AC
  Filled 2022-11-03: qty 1

## 2022-11-03 MED ORDER — FENTANYL CITRATE (PF) 100 MCG/2ML IJ SOLN
25.0000 ug | INTRAMUSCULAR | Status: DC | PRN
  Administered 2022-11-03 (×3): 25 ug via INTRAVENOUS

## 2022-11-03 MED ORDER — SUGAMMADEX SODIUM 200 MG/2ML IV SOLN
INTRAVENOUS | Status: DC | PRN
  Administered 2022-11-03: 200 mg via INTRAVENOUS

## 2022-11-03 MED ORDER — LIDOCAINE HCL (CARDIAC) PF 100 MG/5ML IV SOSY
PREFILLED_SYRINGE | INTRAVENOUS | Status: DC | PRN
  Administered 2022-11-03: 100 mg via INTRAVENOUS

## 2022-11-03 MED ORDER — ONDANSETRON HCL 4 MG/2ML IJ SOLN
INTRAMUSCULAR | Status: DC | PRN
  Administered 2022-11-03: 4 mg via INTRAVENOUS

## 2022-11-03 MED ORDER — GABAPENTIN 300 MG PO CAPS
ORAL_CAPSULE | ORAL | Status: AC
  Administered 2022-11-03: 300 mg via ORAL
  Filled 2022-11-03: qty 1

## 2022-11-03 MED ORDER — HYDROMORPHONE HCL 1 MG/ML IJ SOLN
INTRAMUSCULAR | Status: DC | PRN
  Administered 2022-11-03 (×2): .5 mg via INTRAVENOUS

## 2022-11-03 MED ORDER — ONDANSETRON HCL 4 MG/2ML IJ SOLN: 4.0000 mg | Freq: Once | INTRAMUSCULAR | Status: DC | PRN

## 2022-11-03 MED ORDER — SODIUM CHLORIDE 0.9 % IR SOLN
Status: DC | PRN
  Administered 2022-11-03: 400 mL

## 2022-11-03 MED ORDER — GLYCOPYRROLATE 0.2 MG/ML IJ SOLN
INTRAMUSCULAR | Status: DC | PRN
  Administered 2022-11-03: .1 mg via INTRAVENOUS

## 2022-11-03 MED ORDER — ROCURONIUM BROMIDE 100 MG/10ML IV SOLN
INTRAVENOUS | Status: DC | PRN
  Administered 2022-11-03: 50 mg via INTRAVENOUS
  Administered 2022-11-03: 20 mg via INTRAVENOUS

## 2022-11-03 MED ORDER — LACTATED RINGERS IV SOLN: INTRAVENOUS | Status: DC | PRN

## 2022-11-03 MED ORDER — ONDANSETRON HCL 4 MG/2ML IJ SOLN
INTRAMUSCULAR | Status: AC
  Filled 2022-11-03: qty 2

## 2022-11-03 MED ORDER — DEXAMETHASONE SODIUM PHOSPHATE 10 MG/ML IJ SOLN
INTRAMUSCULAR | Status: DC | PRN
  Administered 2022-11-03: 10 mg via INTRAVENOUS

## 2022-11-03 MED ORDER — PROPOFOL 10 MG/ML IV BOLUS
INTRAVENOUS | Status: DC | PRN
  Administered 2022-11-03: 100 mg via INTRAVENOUS
  Administered 2022-11-03: 70 mg via INTRAVENOUS

## 2022-11-03 MED ORDER — LIDOCAINE HCL (PF) 2 % IJ SOLN
INTRAMUSCULAR | Status: AC
  Filled 2022-11-03: qty 5

## 2022-11-03 SURGICAL SUPPLY — 54 items
ADH SKN CLS APL DERMABOND .7 (GAUZE/BANDAGES/DRESSINGS) ×1
BAG PRESSURE INF REUSE 1000 (BAG) IMPLANT
CANNULA REDUC XI 12-8 STAPL (CANNULA) ×1
CANNULA REDUCER 12-8 DVNC XI (CANNULA) ×1 IMPLANT
CLIP LIGATING HEMO O LOK GREEN (MISCELLANEOUS) ×1 IMPLANT
DERMABOND ADVANCED .7 DNX12 (GAUZE/BANDAGES/DRESSINGS) ×1 IMPLANT
DRAIN CHANNEL JP 19F (MISCELLANEOUS) IMPLANT
DRAPE ARM DVNC X/XI (DISPOSABLE) ×4 IMPLANT
DRAPE COLUMN DVNC XI (DISPOSABLE) ×1 IMPLANT
DRAPE DA VINCI XI ARM (DISPOSABLE) ×4
DRAPE DA VINCI XI COLUMN (DISPOSABLE) ×1
DRSG TEGADERM 4X4.75 (GAUZE/BANDAGES/DRESSINGS) IMPLANT
ELECT CAUTERY BLADE TIP 2.5 (TIP) ×1
ELECT REM PT RETURN 9FT ADLT (ELECTROSURGICAL) ×1
ELECTRODE CAUTERY BLDE TIP 2.5 (TIP) ×1 IMPLANT
ELECTRODE REM PT RTRN 9FT ADLT (ELECTROSURGICAL) ×1 IMPLANT
GAUZE SPONGE 4X4 12PLY STRL (GAUZE/BANDAGES/DRESSINGS) IMPLANT
GLOVE SURG SYN 7.0 (GLOVE) ×2 IMPLANT
GLOVE SURG SYN 7.0 PF PI (GLOVE) ×2 IMPLANT
GLOVE SURG SYN 7.5  E (GLOVE) ×2
GLOVE SURG SYN 7.5 E (GLOVE) ×2 IMPLANT
GLOVE SURG SYN 7.5 PF PI (GLOVE) ×2 IMPLANT
GOWN STRL REUS W/ TWL LRG LVL3 (GOWN DISPOSABLE) ×4 IMPLANT
GOWN STRL REUS W/TWL LRG LVL3 (GOWN DISPOSABLE) ×4
IRRIGATOR SUCT 8 DISP DVNC XI (IRRIGATION / IRRIGATOR) IMPLANT
IRRIGATOR SUCTION 8MM XI DISP (IRRIGATION / IRRIGATOR) ×1
IV NS 1000ML (IV SOLUTION) ×1
IV NS 1000ML BAXH (IV SOLUTION) IMPLANT
KIT PINK PAD W/HEAD ARE REST (MISCELLANEOUS) ×1
KIT PINK PAD W/HEAD ARM REST (MISCELLANEOUS) ×1 IMPLANT
LABEL OR SOLS (LABEL) ×1 IMPLANT
MANIFOLD NEPTUNE II (INSTRUMENTS) ×1 IMPLANT
NEEDLE HYPO 22GX1.5 SAFETY (NEEDLE) ×1 IMPLANT
NS IRRIG 500ML POUR BTL (IV SOLUTION) ×1 IMPLANT
OBTURATOR OPTICAL STANDARD 8MM (TROCAR) ×1
OBTURATOR OPTICAL STND 8 DVNC (TROCAR) ×1
OBTURATOR OPTICALSTD 8 DVNC (TROCAR) ×1 IMPLANT
PACK LAP CHOLECYSTECTOMY (MISCELLANEOUS) ×1 IMPLANT
PENCIL SMOKE EVACUATOR (MISCELLANEOUS) ×1 IMPLANT
SAPPHIRE SUCTION RESERVOIR IMPLANT
SEAL CANN UNIV 5-8 DVNC XI (MISCELLANEOUS) ×3 IMPLANT
SEAL XI 5MM-8MM UNIVERSAL (MISCELLANEOUS) ×3
SET TUBE SMOKE EVAC HIGH FLOW (TUBING) ×1 IMPLANT
SOLUTION ELECTROLUBE (MISCELLANEOUS) ×1 IMPLANT
STAPLER CANNULA SEAL DVNC XI (STAPLE) ×1 IMPLANT
STAPLER CANNULA SEAL XI (STAPLE) ×1
SUT ETHILON 2 0 FS 18 (SUTURE) IMPLANT
SUT MNCRL AB 4-0 PS2 18 (SUTURE) ×1 IMPLANT
SUT VIC AB 3-0 SH 27 (SUTURE) ×1
SUT VIC AB 3-0 SH 27X BRD (SUTURE) IMPLANT
SUT VICRYL 0 UR6 27IN ABS (SUTURE) ×2 IMPLANT
SYS BAG RETRIEVAL 10MM (BASKET) ×1
SYSTEM BAG RETRIEVAL 10MM (BASKET) ×1 IMPLANT
WATER STERILE IRR 500ML POUR (IV SOLUTION) ×1 IMPLANT

## 2022-11-03 NOTE — Transfer of Care (Signed)
Immediate Anesthesia Transfer of Care Note  Patient: Mark Jimenez  Procedure(s) Performed: XI ROBOTIC ASSISTED LAPAROSCOPIC CHOLECYSTECTOMY INDOCYANINE GREEN FLUORESCENCE IMAGING (ICG)  Patient Location: PACU  Anesthesia Type:General  Level of Consciousness: awake, alert , oriented, and patient cooperative  Airway & Oxygen Therapy: Patient Spontanous Breathing and Patient connected to face mask oxygen  Post-op Assessment: Report given to RN and Post -op Vital signs reviewed and stable  Post vital signs: Reviewed and stable  Last Vitals:  Vitals Value Taken Time  BP 156/68 11/03/22 1507  Temp    Pulse 84 11/03/22 1509  Resp 18 11/03/22 1509  SpO2 99 % 11/03/22 1509  Vitals shown include unvalidated device data.  Last Pain:  Vitals:   11/03/22 1135  TempSrc: Temporal  PainSc: 2          Complications: No notable events documented.

## 2022-11-03 NOTE — Discharge Instructions (Addendum)
Discharge Instructions: 1.  Patient may shower, but do not scrub wounds heavily and dab dry only. 2.  Do not submerge wounds in pool/tub until fully healed. 3.  Do not apply ointments or hydrogen peroxide to the wounds. 4.  May apply ice packs to the wounds for comfort. 5.  Please empty and record drain output twice daily and as needed to keep the bulb only half full at the most. 6.  Please change dressing around the drain once daily with 4x4 gauze and tape. 7.  Do not drive while taking narcotics for pain control.  Prior to driving, make sure you are able to rotate right and left to look at blindspots without significant pain or discomfort. 8.  No heavy lifting or pushing of more than 10-15 lbs for 4 weeks. AMBULATORY SURGERY  DISCHARGE INSTRUCTIONS   The drugs that you were given will stay in your system until tomorrow so for the next 24 hours you should not:  Drive an automobile Make any legal decisions Drink any alcoholic beverage   You may resume regular meals tomorrow.  Today it is better to start with liquids and gradually work up to solid foods.  You may eat anything you prefer, but it is better to start with liquids, then soup and crackers, and gradually work up to solid foods.   Please notify your doctor immediately if you have any unusual bleeding, trouble breathing, redness and pain at the surgery site, drainage, fever, or pain not relieved by medication.    Your post-operative visit with Dr.                                       is: Date:                        Time:    Please call to schedule your post-operative visit.  Additional Instructions:

## 2022-11-03 NOTE — Anesthesia Procedure Notes (Signed)
Procedure Name: Intubation Date/Time: 11/03/2022 12:38 PM  Performed by: Adalberto Ill, CRNAPre-anesthesia Checklist: Patient identified, Emergency Drugs available, Suction available, Patient being monitored and Timeout performed Patient Re-evaluated:Patient Re-evaluated prior to induction Oxygen Delivery Method: Circle system utilized Preoxygenation: Pre-oxygenation with 100% oxygen Induction Type: IV induction Ventilation: Mask ventilation without difficulty Laryngoscope Size: Miller and 2 Grade View: Grade I Tube type: Oral Tube size: 7.5 mm Number of attempts: 1 Airway Equipment and Method: Stylet Placement Confirmation: positive ETCO2, ETT inserted through vocal cords under direct vision and breath sounds checked- equal and bilateral Secured at: 22 cm Tube secured with: Tape Dental Injury: Teeth and Oropharynx as per pre-operative assessment  Comments: Brief atraumatic

## 2022-11-03 NOTE — Interval H&P Note (Signed)
History and Physical Interval Note:  11/03/2022 12:06 PM  Mark Jimenez  has presented today for surgery, with the diagnosis of symptomatic cholelithiasis.  The various methods of treatment have been discussed with the patient and family. After consideration of risks, benefits and other options for treatment, the patient has consented to  Procedure(s): XI ROBOTIC ASSISTED LAPAROSCOPIC CHOLECYSTECTOMY (N/A) [place] (ICG) (N/A) as a surgical intervention.  The patient's history has been reviewed, patient examined, no change in status, stable for surgery.  I have reviewed the patient's chart and labs.  Questions were answered to the patient's satisfaction.     Damion Kant

## 2022-11-03 NOTE — Anesthesia Preprocedure Evaluation (Addendum)
Anesthesia Evaluation  Patient identified by MRN, date of birth, ID band Patient awake    Reviewed: Allergy & Precautions, NPO status , Patient's Chart, lab work & pertinent test results  Airway Mallampati: III  TM Distance: >3 FB Neck ROM: full    Dental  (+) Partial Upper   Pulmonary neg pulmonary ROS, former smoker   Pulmonary exam normal breath sounds clear to auscultation       Cardiovascular Exercise Tolerance: Good negative cardio ROS Normal cardiovascular exam Rhythm:Regular     Neuro/Psych   Anxiety     negative neurological ROS  negative psych ROS   GI/Hepatic negative GI ROS, Neg liver ROS,GERD  Medicated,,  Endo/Other  negative endocrine ROSHypothyroidism    Renal/GU CRFRenal disease     Musculoskeletal  (+) Arthritis ,    Abdominal Normal abdominal exam  (+)   Peds negative pediatric ROS (+)  Hematology negative hematology ROS (+) Blood dyscrasia, anemia   Anesthesia Other Findings Past Medical History: No date: Anemia No date: Chronic kidney disease No date: Chronic tophaceous gout No date: GERD (gastroesophageal reflux disease) No date: Gout No date: History of kidney stones No date: Peripheral neuropathy No date: Wears dentures     Comment:  partial upper  Past Surgical History: 01/06/2022: CATARACT EXTRACTION W/PHACO; Left     Comment:  Procedure: CATARACT EXTRACTION PHACO AND INTRAOCULAR               LENS PLACEMENT (IOC) LEFT 5.16 00:41.4;  Surgeon:               Birder Robson, MD;  Location: [facility];                Service: Ophthalmology;  Laterality: Left; No date: EYE SURGERY 02/21/2021: HOLEP-LASER ENUCLEATION OF THE PROSTATE WITH MORCELLATION;  N/A     Comment:  Procedure: HOLEP-LASER ENUCLEATION OF THE PROSTATE WITH               MORCELLATION;  Surgeon: Billey Co, MD;  Location:               [facility];  Service: Urology;  Laterality: N/A; No date:  TONSILLECTOMY 08/31/2020: TOTAL HIP ARTHROPLASTY; Left     Comment:  Procedure: TOTAL HIP ARTHROPLASTY ANTERIOR APPROACH;                Surgeon: Hessie Knows, MD;  Location: [facility];                Service: Orthopedics;  Laterality: Left; No date: VARICOSE VEIN SURGERY; Right  BMI    Body Mass Index: 25.20 kg/m      Reproductive/Obstetrics negative OB ROS                             Anesthesia Physical Anesthesia Plan  ASA: 3  Anesthesia Plan: General   Post-op Pain Management:    Induction:   PONV Risk Score and Plan: Ondansetron, Dexamethasone, Midazolam and Treatment may vary due to age or medical condition  Airway Management Planned: Oral ETT  Additional Equipment:   Intra-op Plan:   Post-operative Plan: Extubation in OR  Informed Consent: I have reviewed the patients History and Physical, chart, labs and discussed the procedure including the risks, benefits and alternatives for the proposed anesthesia with the patient or authorized representative who has indicated his/her understanding and acceptance.     Dental Advisory Given  Plan Discussed with:  CRNA and Surgeon  Anesthesia Plan Comments:        Anesthesia Quick Evaluation

## 2022-11-03 NOTE — Op Note (Signed)
Procedure Date:  11/03/2022  Pre-operative Diagnosis:  Symptomatic cholelithiasis  Post-operative Diagnosis: Acute cholecystitis  Procedure:  Robotic assisted cholecystectomy with ICG FireFly cholangiogram  Surgeon:  Melvyn Neth, MD  Anesthesia:  General endotracheal  Estimated Blood Loss:  30 ml  Specimens:  gallbladder  Complications:  None  Indications for Procedure:  This is a 72 y.o. male who presents with abdominal pain and workup revealing symptomatic cholelithiasis.  The benefits, complications, treatment options, and expected outcomes were discussed with the patient. The risks of bleeding, infection, recurrence of symptoms, failure to resolve symptoms, bile duct damage, bile duct leak, retained common bile duct stone, bowel injury, and need for further procedures were all discussed with the patient and he was willing to proceed.  Description of Procedure: The patient was correctly identified in the preoperative area and brought into the operating room.  The patient was placed supine with VTE prophylaxis in place.  Appropriate time-outs were performed.  Anesthesia was induced and the patient was intubated.  Appropriate antibiotics were infused.  The abdomen was prepped and draped in a sterile fashion. An infraumbilical incision was made. A cutdown technique was used to enter the abdominal cavity without injury, and a 12 mm robotic port was inserted.  Pneumoperitoneum was obtained with appropriate opening pressures.  Three 8-mm ports were placed in the mid abdomen at the level of the umbilicus under direct visualization.  The DaVinci platform was docked, camera targeted, and instruments were placed under direct visualization.  The gallbladder was identified and found to be acutely inflamed.  The fundus was grasped and retracted cephalad.  Adhesions were lysed bluntly and with electrocautery. The infundibulum was grasped and retracted laterally, exposing the peritoneum overlying  the gallbladder.  This was incised with electrocautery and extended on either side of the gallbladder.  FireFly cholangiogram was then obtained, and we were able to clearly identify the cystic duct and common bile duct.  FireFly was used throughout the case to guide our dissection.  The cystic duct was carefully dissected with combination of cautery and blunt dissection.  In the vicinity was the hepatic artery, without a clear view of a true cystic artery.  The duct was clipped twice proximally and once distally, cutting in between.  The space between hepatic artery and the gallbladder edge was cauterized with bipolar and and cautery.  A potential candidate for cystic artery stump coming off the hepatic artery was clipped as a precaution.  The gallbladder was taken from the gallbladder fossa in a retrograde fashion with electrocautery.  There was bleeding from the liver bed and bile spillage.  The gallbladder was placed in an Endocatch bag. The liver bed was inspected and any bleeding was controlled with electrocautery. The right upper quadrant was then inspected again revealing intact clips, no bleeding, and no ductal injury.  The area was thoroughly irrigated until clear fluid return.  A 19 Fr. Blake drain was placed through the right lateral port going to the gallbladder fossa and RUQ.  The 8 mm ports were removed under direct visualization and the 12 mm port was removed.  The Endocatch bag was brought out via the umbilical incision. The fascial opening was closed using 0 vicryl suture.  Local anesthetic was infused in all incisions and the incisions were closed with 4-0 Monocryl.  The drain was secured using 2-0 Nylon.  The wounds were cleaned and sealed with DermaBond.  The drain site was dressed with 4x4 gauze and TegaDerm.  The patient was  emerged from anesthesia and extubated and brought to the recovery room for further management.  The patient tolerated the procedure well and all counts were correct  at the end of the case.   Melvyn Neth, MD

## 2022-11-04 NOTE — Anesthesia Postprocedure Evaluation (Signed)
Anesthesia Post Note  Patient: Mark Jimenez  Procedure(s) Performed: XI ROBOTIC ASSISTED LAPAROSCOPIC CHOLECYSTECTOMY INDOCYANINE GREEN FLUORESCENCE IMAGING (ICG)  Patient location during evaluation: PACU Anesthesia Type: General Level of consciousness: awake Pain management: pain level controlled Vital Signs Assessment: post-procedure vital signs reviewed and stable Respiratory status: spontaneous breathing Cardiovascular status: stable Anesthetic complications: no  No notable events documented.   Last Vitals:  Vitals:   11/03/22 1545 11/03/22 1601  BP: 103/82 134/79  Pulse: 84 87  Resp: 16 18  Temp: 36.4 C (!) 36.4 C  SpO2: 100% 100%    Last Pain:  Vitals:   11/03/22 1601  TempSrc: Temporal  PainSc: 4                  VAN STAVEREN,Marissah Vandemark

## 2022-11-05 LAB — SURGICAL PATHOLOGY

## 2022-11-10 ENCOUNTER — Encounter: Payer: Medicare HMO | Admitting: Physician Assistant

## 2022-11-12 ENCOUNTER — Encounter: Payer: Self-pay | Admitting: Physician Assistant

## 2022-11-12 ENCOUNTER — Ambulatory Visit (INDEPENDENT_AMBULATORY_CARE_PROVIDER_SITE_OTHER): Payer: Medicare HMO | Admitting: Physician Assistant

## 2022-11-12 VITALS — BP 116/71 | HR 96 | Temp 98.2°F

## 2022-11-12 DIAGNOSIS — K8020 Calculus of gallbladder without cholecystitis without obstruction: Secondary | ICD-10-CM

## 2022-11-12 DIAGNOSIS — K810 Acute cholecystitis: Secondary | ICD-10-CM

## 2022-11-12 DIAGNOSIS — Z09 Encounter for follow-up examination after completed treatment for conditions other than malignant neoplasm: Secondary | ICD-10-CM

## 2022-11-12 NOTE — Progress Notes (Signed)
[facility] POST-OP OFFICE VISIT  11/12/2022  HPI: Mark Jimenez is a 72 y.o. male 9 days s/p robotic assisted laparoscopic cholecystectomy for acute cholecystitis with Dr Kirke Corin   He is doing reasonably well; seems to be at his baseline Abdominal discomfort; primarily at drain site itself No fever, chills, nausea, emesis, or diarrhea Less of an appetite than before but this is improving Drain with <20 ccs out; serous Incisions healing well  Vital signs: BP 116/71   Pulse 96   Temp 98.2 F (36.8 C) (Oral)   SpO2 98%    Physical Exam: Constitutional: Well appearing male, NAD; sitting in wheelchair Abdomen: Soft, non-tender, non-distended, no rebound/guarding. Surgical drain in right abdomen; output serous (removed) Skin: Laparoscopic incisions are healing well, no erythema or drainage   Assessment/Plan: This is a 72 y.o. male 9 days s/p robotic assisted laparoscopic cholecystectomy for acute cholecystitis with Dr Kirke Corin    - Drain removed; dressing placed   - Pain control prn  - Reviewed wound care recommendation  - Reviewed lifting restrictions; 4 weeks total  - Reviewed surgical pathology; Cholecystitis   - He can follow up on as needed basis; He understands to call with questions/concerns  -- Edison Simon, PA-C [facility] 11/12/2022, 1:56 PM M-F: 7am - 4pm

## 2022-11-12 NOTE — Patient Instructions (Signed)

## 2023-01-15 DIAGNOSIS — N1832 Chronic kidney disease, stage 3b: Secondary | ICD-10-CM | POA: Diagnosis not present

## 2023-01-15 DIAGNOSIS — Z09 Encounter for follow-up examination after completed treatment for conditions other than malignant neoplasm: Secondary | ICD-10-CM | POA: Diagnosis not present

## 2023-01-15 DIAGNOSIS — M1000 Idiopathic gout, unspecified site: Secondary | ICD-10-CM | POA: Diagnosis not present

## 2023-01-15 DIAGNOSIS — G629 Polyneuropathy, unspecified: Secondary | ICD-10-CM | POA: Diagnosis not present

## 2023-01-15 DIAGNOSIS — E559 Vitamin D deficiency, unspecified: Secondary | ICD-10-CM | POA: Diagnosis not present

## 2023-01-15 DIAGNOSIS — M159 Polyosteoarthritis, unspecified: Secondary | ICD-10-CM | POA: Diagnosis not present

## 2023-01-15 DIAGNOSIS — R69 Illness, unspecified: Secondary | ICD-10-CM | POA: Diagnosis not present

## 2023-01-15 DIAGNOSIS — K219 Gastro-esophageal reflux disease without esophagitis: Secondary | ICD-10-CM | POA: Diagnosis not present

## 2023-01-15 DIAGNOSIS — I950 Idiopathic hypotension: Secondary | ICD-10-CM | POA: Diagnosis not present

## 2023-01-15 DIAGNOSIS — E538 Deficiency of other specified B group vitamins: Secondary | ICD-10-CM | POA: Diagnosis not present

## 2023-02-12 DIAGNOSIS — H35372 Puckering of macula, left eye: Secondary | ICD-10-CM | POA: Diagnosis not present

## 2023-04-14 DIAGNOSIS — D509 Iron deficiency anemia, unspecified: Secondary | ICD-10-CM | POA: Diagnosis not present

## 2023-04-14 DIAGNOSIS — R197 Diarrhea, unspecified: Secondary | ICD-10-CM | POA: Diagnosis not present

## 2023-04-14 DIAGNOSIS — R194 Change in bowel habit: Secondary | ICD-10-CM | POA: Diagnosis not present

## 2023-04-15 DIAGNOSIS — R197 Diarrhea, unspecified: Secondary | ICD-10-CM | POA: Diagnosis not present

## 2023-04-26 ENCOUNTER — Ambulatory Visit: Admission: RE | Admit: 2023-04-26 | Payer: Medicare HMO | Source: Home / Self Care

## 2023-04-26 ENCOUNTER — Encounter: Admission: RE | Payer: Self-pay | Source: Home / Self Care

## 2023-04-26 SURGERY — COLONOSCOPY WITH PROPOFOL
Anesthesia: General

## 2023-07-23 ENCOUNTER — Ambulatory Visit: Admit: 2023-07-23 | Payer: Medicare HMO

## 2023-07-23 SURGERY — COLONOSCOPY WITH PROPOFOL

## 2023-09-06 IMAGING — MR MR HEAD W/O CM
10 series · 48 of 48 positions shown · non-contrast
Comparison: None Available.

CLINICAL DATA: Dizziness blurring common bilateral. History of
injury.

EXAM:
MRI HEAD WITHOUT CONTRAST
TECHNIQUE: Multiplanar, multiecho pulse sequences of the brain and surrounding
structures were obtained without intravenous contrast.

[Series 2: DWI · axial · 3.0mm · 1.46mm/px · z∈[-20,+133]mm · 8 of 110 slices shown (1 of 4)]
[im 1/110]
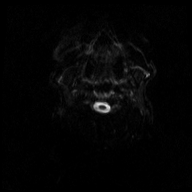
[im 16/110]
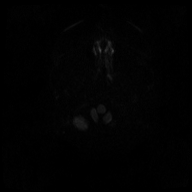
[im 32/110]
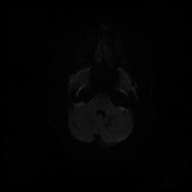
[im 47/110]
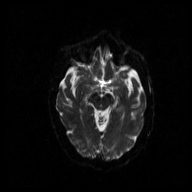
[im 63/110]
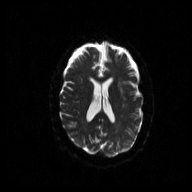
[im 78/110]
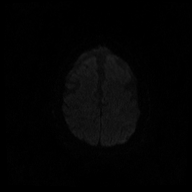
[im 94/110]
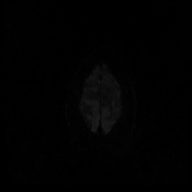
[im 110/110]
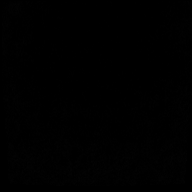

[Series 3: DWI · axial · 3.0mm · 1.46mm/px · z∈[-20,+133]mm · 5 of 55 slices shown (2 of 4)]
[im 1/55]
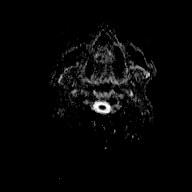
[im 14/55]
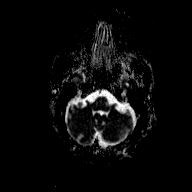
[im 28/55]
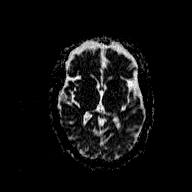
[im 41/55]
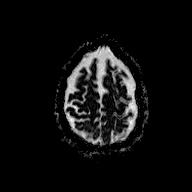
[im 55/55]
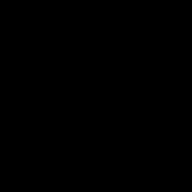

[Series 4: DWI · coronal · 5.0mm · 1.46mm/px · 6 of 67 slices shown (3 of 4)]
[im 1/67]
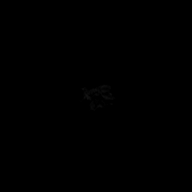
[im 14/67]
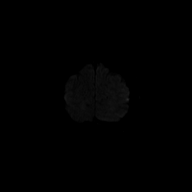
[im 27/67]
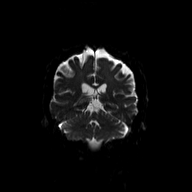
[im 40/67]
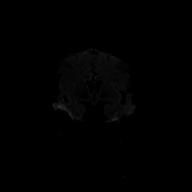
[im 53/67]
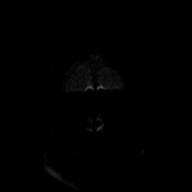
[im 67/67]
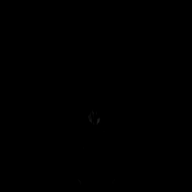

[Series 5: DWI · coronal · 5.0mm · 1.46mm/px · 3 of 34 slices shown (4 of 4)]
[im 1/34]
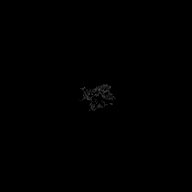
[im 17/34]
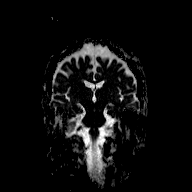
[im 34/34]
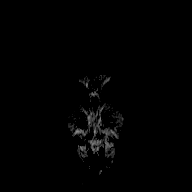

[Series 6: T1 · sagittal · 5.0mm · 0.49mm/px · 2 of 25 slices shown (1 of 2)]
[im 1/25]
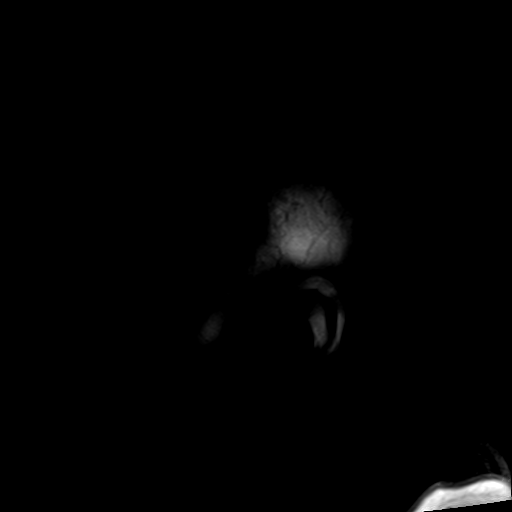
[im 25/25]
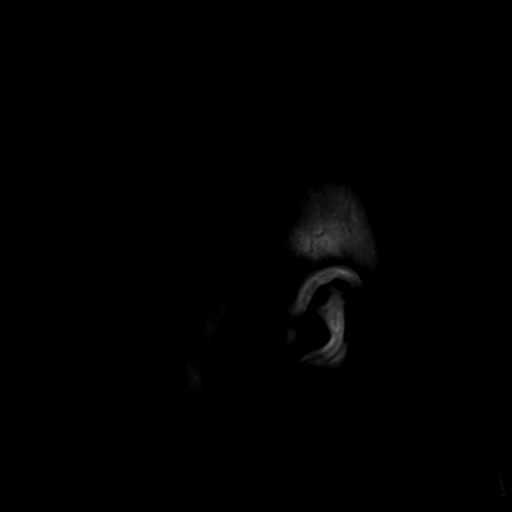

[Series 7: T2 · axial · 5.0mm · 0.72mm/px · z∈[-36,+112]mm · 2 of 23 slices shown (1 of 3)]
[im 1/23]
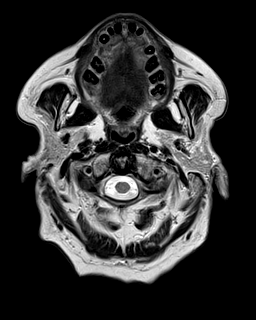
[im 23/23]
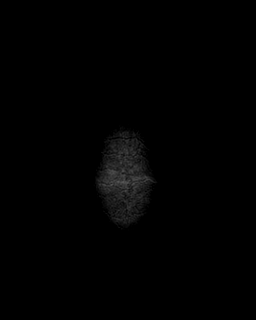

[Series 8: FLAIR · axial · 3.0mm · 0.45mm/px · z∈[-41,+115]mm · 5 of 55 slices shown]
[im 1/55]
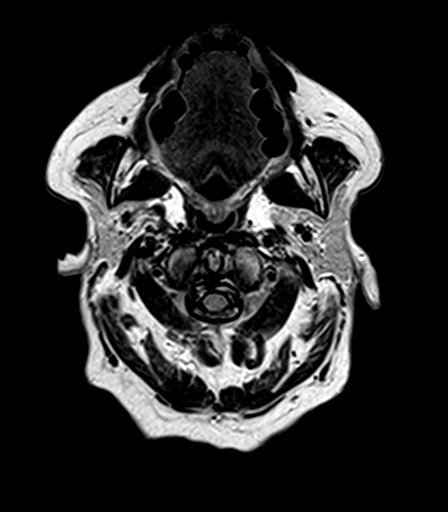
[im 14/55]
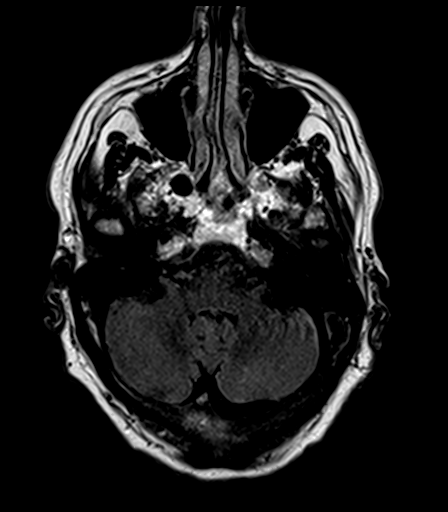
[im 28/55]
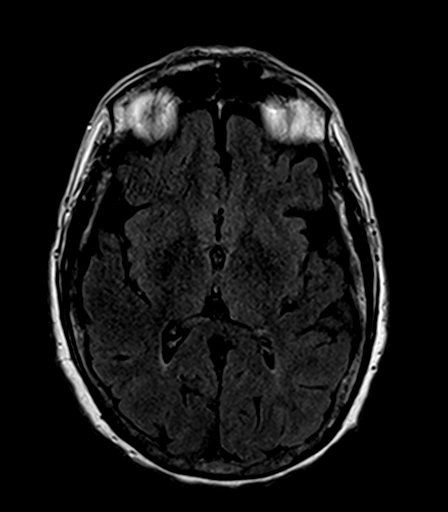
[im 41/55]
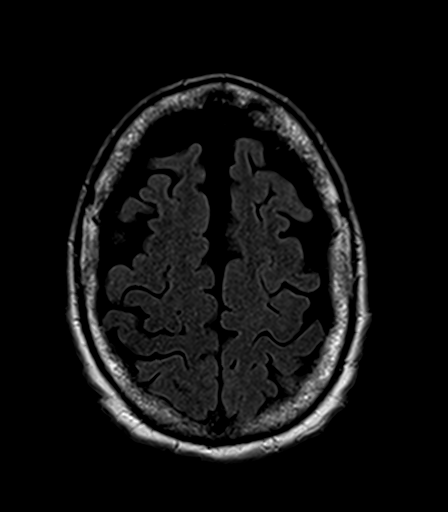
[im 55/55]
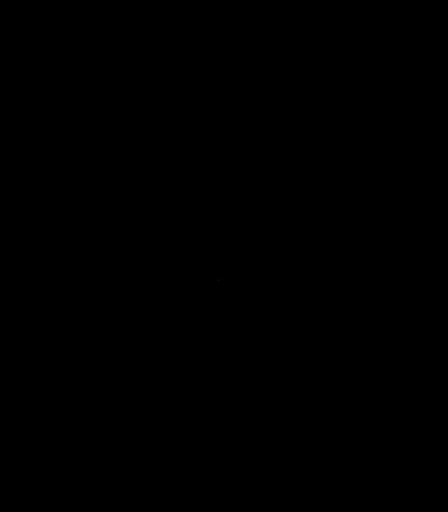

[Series 9: T2 · axial · 5.0mm · 0.72mm/px · z∈[-37,+111]mm · 2 of 23 slices shown (2 of 3)]
[im 1/23]
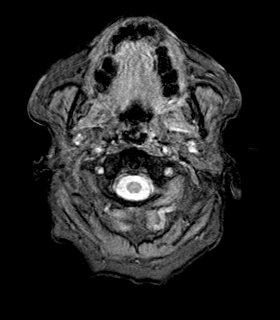
[im 23/23]
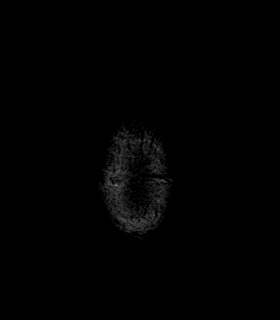

[Series 10: T1 · axial · 1.0mm · 0.94mm/px · z∈[-38,+115]mm · 13 of 160 slices shown (2 of 2)]
[im 1/160]
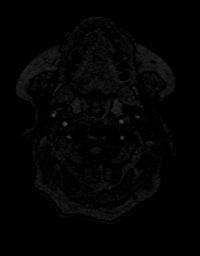
[im 14/160]
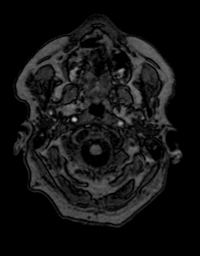
[im 27/160]
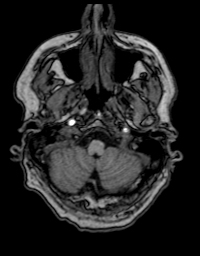
[im 40/160]
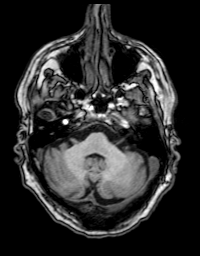
[im 54/160]
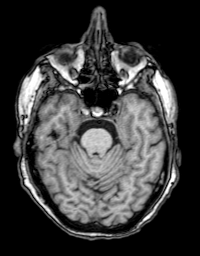
[im 67/160]
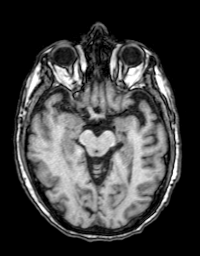
[im 80/160]
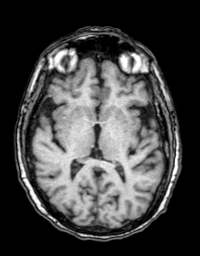
[im 93/160]
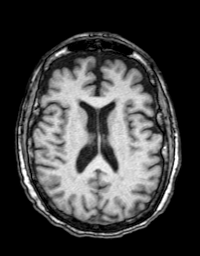
[im 107/160]
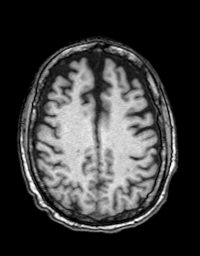
[im 120/160]
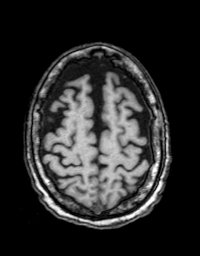
[im 133/160]
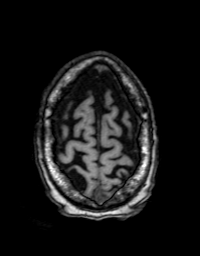
[im 146/160]
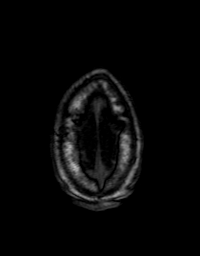
[im 160/160]
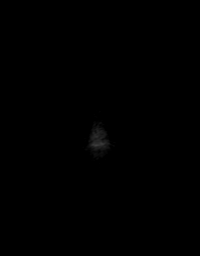

[Series 11: T2 · coronal · 5.0mm · 0.43mm/px · 2 of 29 slices shown (3 of 3)]
[im 1/29]
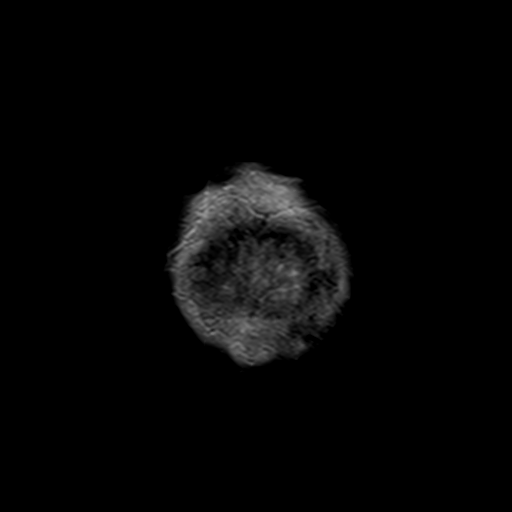
[im 29/29]
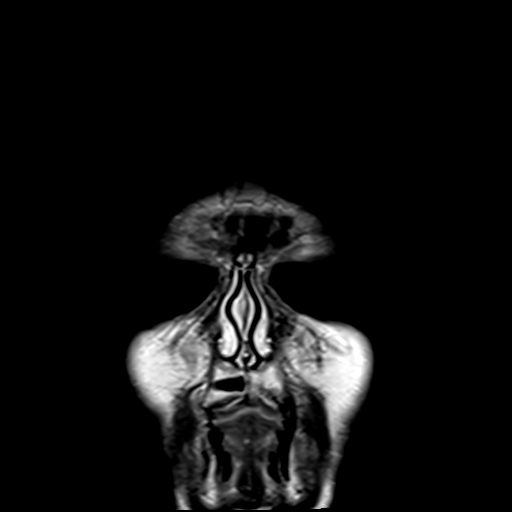

[48 of 48 positions shown; findings below may reference images not displayed]

FINDINGS: Brain: No acute infarct, hemorrhage, or mass lesion is present. No
significant white matter lesions are present. The ventricles are of
normal size. No significant extraaxial fluid collection is present.

The internal auditory canals are within normal limits. The brainstem
and cerebellum are within normal limits.

Vascular: Flow is present in the major intracranial arteries.

Skull and upper cervical spine: Degenerative endplate changes are
present with disc disease at C3-4. Upper cervical spine is otherwise
within normal limits. Craniocervical junction is normal. Marrow
signal is otherwise within normal limits.

Sinuses/Orbits: Bilateral mastoid effusions are present, right
greater than left. No obstructing nasopharyngeal lesion is present.
The paranasal sinuses and mastoid air cells are otherwise clear.
Left lens replacement is noted. Globes and orbits are otherwise
within normal limits.
IMPRESSION: 1. Normal MRI appearance of the brain. No acute or focal lesion to
explain the patient's symptoms.
2. Bilateral mastoid effusions, right greater than left. No
obstructing nasopharyngeal lesion is present.

## 2024-02-22 DIAGNOSIS — I950 Idiopathic hypotension: Secondary | ICD-10-CM | POA: Diagnosis not present

## 2024-02-22 DIAGNOSIS — R42 Dizziness and giddiness: Secondary | ICD-10-CM | POA: Diagnosis not present

## 2024-02-22 DIAGNOSIS — E538 Deficiency of other specified B group vitamins: Secondary | ICD-10-CM | POA: Diagnosis not present

## 2024-02-22 DIAGNOSIS — D631 Anemia in chronic kidney disease: Secondary | ICD-10-CM | POA: Diagnosis not present

## 2024-02-22 DIAGNOSIS — M8580 Other specified disorders of bone density and structure, unspecified site: Secondary | ICD-10-CM | POA: Diagnosis not present

## 2024-02-22 DIAGNOSIS — Z125 Encounter for screening for malignant neoplasm of prostate: Secondary | ICD-10-CM | POA: Diagnosis not present

## 2024-02-22 DIAGNOSIS — D500 Iron deficiency anemia secondary to blood loss (chronic): Secondary | ICD-10-CM | POA: Diagnosis not present

## 2024-02-22 DIAGNOSIS — Z1331 Encounter for screening for depression: Secondary | ICD-10-CM | POA: Diagnosis not present

## 2024-02-22 DIAGNOSIS — Z0000 Encounter for general adult medical examination without abnormal findings: Secondary | ICD-10-CM | POA: Diagnosis not present

## 2024-02-22 DIAGNOSIS — M1000 Idiopathic gout, unspecified site: Secondary | ICD-10-CM | POA: Diagnosis not present

## 2024-02-22 DIAGNOSIS — K8010 Calculus of gallbladder with chronic cholecystitis without obstruction: Secondary | ICD-10-CM | POA: Diagnosis not present

## 2024-02-22 DIAGNOSIS — E559 Vitamin D deficiency, unspecified: Secondary | ICD-10-CM | POA: Diagnosis not present

## 2024-02-22 DIAGNOSIS — E039 Hypothyroidism, unspecified: Secondary | ICD-10-CM | POA: Diagnosis not present

## 2024-02-22 DIAGNOSIS — F419 Anxiety disorder, unspecified: Secondary | ICD-10-CM | POA: Diagnosis not present

## 2024-02-22 DIAGNOSIS — Z1389 Encounter for screening for other disorder: Secondary | ICD-10-CM | POA: Diagnosis not present

## 2024-02-22 DIAGNOSIS — F1090 Alcohol use, unspecified, uncomplicated: Secondary | ICD-10-CM | POA: Diagnosis not present

## 2024-02-22 DIAGNOSIS — Z131 Encounter for screening for diabetes mellitus: Secondary | ICD-10-CM | POA: Diagnosis not present

## 2024-02-22 DIAGNOSIS — G609 Hereditary and idiopathic neuropathy, unspecified: Secondary | ICD-10-CM | POA: Diagnosis not present

## 2024-02-22 DIAGNOSIS — Z13220 Encounter for screening for lipoid disorders: Secondary | ICD-10-CM | POA: Diagnosis not present

## 2024-02-22 DIAGNOSIS — F5105 Insomnia due to other mental disorder: Secondary | ICD-10-CM | POA: Diagnosis not present

## 2024-02-22 DIAGNOSIS — N1832 Chronic kidney disease, stage 3b: Secondary | ICD-10-CM | POA: Diagnosis not present

## 2024-03-28 DIAGNOSIS — M159 Polyosteoarthritis, unspecified: Secondary | ICD-10-CM | POA: Diagnosis not present

## 2024-03-28 DIAGNOSIS — Z5982 Transportation insecurity: Secondary | ICD-10-CM | POA: Diagnosis not present

## 2024-03-28 DIAGNOSIS — Z9181 History of falling: Secondary | ICD-10-CM | POA: Diagnosis not present

## 2024-03-28 DIAGNOSIS — D631 Anemia in chronic kidney disease: Secondary | ICD-10-CM | POA: Diagnosis not present

## 2024-03-28 DIAGNOSIS — K219 Gastro-esophageal reflux disease without esophagitis: Secondary | ICD-10-CM | POA: Diagnosis not present

## 2024-03-28 DIAGNOSIS — E43 Unspecified severe protein-calorie malnutrition: Secondary | ICD-10-CM | POA: Diagnosis not present

## 2024-03-28 DIAGNOSIS — D509 Iron deficiency anemia, unspecified: Secondary | ICD-10-CM | POA: Diagnosis not present

## 2024-03-28 DIAGNOSIS — M8580 Other specified disorders of bone density and structure, unspecified site: Secondary | ICD-10-CM | POA: Diagnosis not present

## 2024-03-28 DIAGNOSIS — E559 Vitamin D deficiency, unspecified: Secondary | ICD-10-CM | POA: Diagnosis not present

## 2024-03-28 DIAGNOSIS — R339 Retention of urine, unspecified: Secondary | ICD-10-CM | POA: Diagnosis not present

## 2024-03-28 DIAGNOSIS — K8010 Calculus of gallbladder with chronic cholecystitis without obstruction: Secondary | ICD-10-CM | POA: Diagnosis not present

## 2024-03-28 DIAGNOSIS — I950 Idiopathic hypotension: Secondary | ICD-10-CM | POA: Diagnosis not present

## 2024-03-28 DIAGNOSIS — E538 Deficiency of other specified B group vitamins: Secondary | ICD-10-CM | POA: Diagnosis not present

## 2024-03-28 DIAGNOSIS — Z556 Problems related to health literacy: Secondary | ICD-10-CM | POA: Diagnosis not present

## 2024-03-28 DIAGNOSIS — F419 Anxiety disorder, unspecified: Secondary | ICD-10-CM | POA: Diagnosis not present

## 2024-03-28 DIAGNOSIS — N1832 Chronic kidney disease, stage 3b: Secondary | ICD-10-CM | POA: Diagnosis not present

## 2024-03-28 DIAGNOSIS — Z96642 Presence of left artificial hip joint: Secondary | ICD-10-CM | POA: Diagnosis not present

## 2024-03-28 DIAGNOSIS — E039 Hypothyroidism, unspecified: Secondary | ICD-10-CM | POA: Diagnosis not present

## 2024-03-28 DIAGNOSIS — G9009 Other idiopathic peripheral autonomic neuropathy: Secondary | ICD-10-CM | POA: Diagnosis not present

## 2024-03-28 DIAGNOSIS — M2040 Other hammer toe(s) (acquired), unspecified foot: Secondary | ICD-10-CM | POA: Diagnosis not present

## 2024-03-28 DIAGNOSIS — R627 Adult failure to thrive: Secondary | ICD-10-CM | POA: Diagnosis not present

## 2024-03-28 DIAGNOSIS — R42 Dizziness and giddiness: Secondary | ICD-10-CM | POA: Diagnosis not present

## 2024-03-28 DIAGNOSIS — G894 Chronic pain syndrome: Secondary | ICD-10-CM | POA: Diagnosis not present

## 2024-03-28 DIAGNOSIS — M1A9XX1 Chronic gout, unspecified, with tophus (tophi): Secondary | ICD-10-CM | POA: Diagnosis not present

## 2024-03-31 DIAGNOSIS — R339 Retention of urine, unspecified: Secondary | ICD-10-CM | POA: Diagnosis not present

## 2024-03-31 DIAGNOSIS — E538 Deficiency of other specified B group vitamins: Secondary | ICD-10-CM | POA: Diagnosis not present

## 2024-03-31 DIAGNOSIS — E43 Unspecified severe protein-calorie malnutrition: Secondary | ICD-10-CM | POA: Diagnosis not present

## 2024-03-31 DIAGNOSIS — E039 Hypothyroidism, unspecified: Secondary | ICD-10-CM | POA: Diagnosis not present

## 2024-03-31 DIAGNOSIS — Z9181 History of falling: Secondary | ICD-10-CM | POA: Diagnosis not present

## 2024-03-31 DIAGNOSIS — Z96642 Presence of left artificial hip joint: Secondary | ICD-10-CM | POA: Diagnosis not present

## 2024-03-31 DIAGNOSIS — M8580 Other specified disorders of bone density and structure, unspecified site: Secondary | ICD-10-CM | POA: Diagnosis not present

## 2024-03-31 DIAGNOSIS — F419 Anxiety disorder, unspecified: Secondary | ICD-10-CM | POA: Diagnosis not present

## 2024-03-31 DIAGNOSIS — G9009 Other idiopathic peripheral autonomic neuropathy: Secondary | ICD-10-CM | POA: Diagnosis not present

## 2024-03-31 DIAGNOSIS — R627 Adult failure to thrive: Secondary | ICD-10-CM | POA: Diagnosis not present

## 2024-03-31 DIAGNOSIS — K8010 Calculus of gallbladder with chronic cholecystitis without obstruction: Secondary | ICD-10-CM | POA: Diagnosis not present

## 2024-03-31 DIAGNOSIS — I950 Idiopathic hypotension: Secondary | ICD-10-CM | POA: Diagnosis not present

## 2024-03-31 DIAGNOSIS — K219 Gastro-esophageal reflux disease without esophagitis: Secondary | ICD-10-CM | POA: Diagnosis not present

## 2024-03-31 DIAGNOSIS — G894 Chronic pain syndrome: Secondary | ICD-10-CM | POA: Diagnosis not present

## 2024-03-31 DIAGNOSIS — M159 Polyosteoarthritis, unspecified: Secondary | ICD-10-CM | POA: Diagnosis not present

## 2024-03-31 DIAGNOSIS — D509 Iron deficiency anemia, unspecified: Secondary | ICD-10-CM | POA: Diagnosis not present

## 2024-03-31 DIAGNOSIS — M1A9XX1 Chronic gout, unspecified, with tophus (tophi): Secondary | ICD-10-CM | POA: Diagnosis not present

## 2024-03-31 DIAGNOSIS — E559 Vitamin D deficiency, unspecified: Secondary | ICD-10-CM | POA: Diagnosis not present

## 2024-03-31 DIAGNOSIS — M2040 Other hammer toe(s) (acquired), unspecified foot: Secondary | ICD-10-CM | POA: Diagnosis not present

## 2024-03-31 DIAGNOSIS — Z556 Problems related to health literacy: Secondary | ICD-10-CM | POA: Diagnosis not present

## 2024-03-31 DIAGNOSIS — N1832 Chronic kidney disease, stage 3b: Secondary | ICD-10-CM | POA: Diagnosis not present

## 2024-03-31 DIAGNOSIS — R42 Dizziness and giddiness: Secondary | ICD-10-CM | POA: Diagnosis not present

## 2024-03-31 DIAGNOSIS — Z5982 Transportation insecurity: Secondary | ICD-10-CM | POA: Diagnosis not present

## 2024-03-31 DIAGNOSIS — D631 Anemia in chronic kidney disease: Secondary | ICD-10-CM | POA: Diagnosis not present

## 2024-04-06 DIAGNOSIS — I950 Idiopathic hypotension: Secondary | ICD-10-CM | POA: Diagnosis not present

## 2024-04-06 DIAGNOSIS — M1A9XX1 Chronic gout, unspecified, with tophus (tophi): Secondary | ICD-10-CM | POA: Diagnosis not present

## 2024-04-06 DIAGNOSIS — N1832 Chronic kidney disease, stage 3b: Secondary | ICD-10-CM | POA: Diagnosis not present

## 2024-04-06 DIAGNOSIS — R339 Retention of urine, unspecified: Secondary | ICD-10-CM | POA: Diagnosis not present

## 2024-04-06 DIAGNOSIS — D631 Anemia in chronic kidney disease: Secondary | ICD-10-CM | POA: Diagnosis not present

## 2024-04-06 DIAGNOSIS — M159 Polyosteoarthritis, unspecified: Secondary | ICD-10-CM | POA: Diagnosis not present

## 2024-04-06 DIAGNOSIS — Z556 Problems related to health literacy: Secondary | ICD-10-CM | POA: Diagnosis not present

## 2024-04-06 DIAGNOSIS — K8010 Calculus of gallbladder with chronic cholecystitis without obstruction: Secondary | ICD-10-CM | POA: Diagnosis not present

## 2024-04-06 DIAGNOSIS — M2040 Other hammer toe(s) (acquired), unspecified foot: Secondary | ICD-10-CM | POA: Diagnosis not present

## 2024-04-06 DIAGNOSIS — F419 Anxiety disorder, unspecified: Secondary | ICD-10-CM | POA: Diagnosis not present

## 2024-04-06 DIAGNOSIS — G894 Chronic pain syndrome: Secondary | ICD-10-CM | POA: Diagnosis not present

## 2024-04-06 DIAGNOSIS — E559 Vitamin D deficiency, unspecified: Secondary | ICD-10-CM | POA: Diagnosis not present

## 2024-04-06 DIAGNOSIS — M8580 Other specified disorders of bone density and structure, unspecified site: Secondary | ICD-10-CM | POA: Diagnosis not present

## 2024-04-06 DIAGNOSIS — Z9181 History of falling: Secondary | ICD-10-CM | POA: Diagnosis not present

## 2024-04-06 DIAGNOSIS — G9009 Other idiopathic peripheral autonomic neuropathy: Secondary | ICD-10-CM | POA: Diagnosis not present

## 2024-04-06 DIAGNOSIS — Z5982 Transportation insecurity: Secondary | ICD-10-CM | POA: Diagnosis not present

## 2024-04-06 DIAGNOSIS — K219 Gastro-esophageal reflux disease without esophagitis: Secondary | ICD-10-CM | POA: Diagnosis not present

## 2024-04-06 DIAGNOSIS — E538 Deficiency of other specified B group vitamins: Secondary | ICD-10-CM | POA: Diagnosis not present

## 2024-04-06 DIAGNOSIS — E039 Hypothyroidism, unspecified: Secondary | ICD-10-CM | POA: Diagnosis not present

## 2024-04-06 DIAGNOSIS — E43 Unspecified severe protein-calorie malnutrition: Secondary | ICD-10-CM | POA: Diagnosis not present

## 2024-04-06 DIAGNOSIS — R627 Adult failure to thrive: Secondary | ICD-10-CM | POA: Diagnosis not present

## 2024-04-06 DIAGNOSIS — Z96642 Presence of left artificial hip joint: Secondary | ICD-10-CM | POA: Diagnosis not present

## 2024-04-06 DIAGNOSIS — R42 Dizziness and giddiness: Secondary | ICD-10-CM | POA: Diagnosis not present

## 2024-04-06 DIAGNOSIS — D509 Iron deficiency anemia, unspecified: Secondary | ICD-10-CM | POA: Diagnosis not present

## 2024-04-12 DIAGNOSIS — M2040 Other hammer toe(s) (acquired), unspecified foot: Secondary | ICD-10-CM | POA: Diagnosis not present

## 2024-04-12 DIAGNOSIS — N1832 Chronic kidney disease, stage 3b: Secondary | ICD-10-CM | POA: Diagnosis not present

## 2024-04-12 DIAGNOSIS — F419 Anxiety disorder, unspecified: Secondary | ICD-10-CM | POA: Diagnosis not present

## 2024-04-12 DIAGNOSIS — R627 Adult failure to thrive: Secondary | ICD-10-CM | POA: Diagnosis not present

## 2024-04-12 DIAGNOSIS — E039 Hypothyroidism, unspecified: Secondary | ICD-10-CM | POA: Diagnosis not present

## 2024-04-12 DIAGNOSIS — M1A9XX1 Chronic gout, unspecified, with tophus (tophi): Secondary | ICD-10-CM | POA: Diagnosis not present

## 2024-04-12 DIAGNOSIS — Z96642 Presence of left artificial hip joint: Secondary | ICD-10-CM | POA: Diagnosis not present

## 2024-04-12 DIAGNOSIS — Z556 Problems related to health literacy: Secondary | ICD-10-CM | POA: Diagnosis not present

## 2024-04-12 DIAGNOSIS — E538 Deficiency of other specified B group vitamins: Secondary | ICD-10-CM | POA: Diagnosis not present

## 2024-04-12 DIAGNOSIS — K219 Gastro-esophageal reflux disease without esophagitis: Secondary | ICD-10-CM | POA: Diagnosis not present

## 2024-04-12 DIAGNOSIS — D509 Iron deficiency anemia, unspecified: Secondary | ICD-10-CM | POA: Diagnosis not present

## 2024-04-12 DIAGNOSIS — E43 Unspecified severe protein-calorie malnutrition: Secondary | ICD-10-CM | POA: Diagnosis not present

## 2024-04-12 DIAGNOSIS — K8010 Calculus of gallbladder with chronic cholecystitis without obstruction: Secondary | ICD-10-CM | POA: Diagnosis not present

## 2024-04-12 DIAGNOSIS — Z9181 History of falling: Secondary | ICD-10-CM | POA: Diagnosis not present

## 2024-04-12 DIAGNOSIS — M159 Polyosteoarthritis, unspecified: Secondary | ICD-10-CM | POA: Diagnosis not present

## 2024-04-12 DIAGNOSIS — R339 Retention of urine, unspecified: Secondary | ICD-10-CM | POA: Diagnosis not present

## 2024-04-12 DIAGNOSIS — G9009 Other idiopathic peripheral autonomic neuropathy: Secondary | ICD-10-CM | POA: Diagnosis not present

## 2024-04-12 DIAGNOSIS — M8580 Other specified disorders of bone density and structure, unspecified site: Secondary | ICD-10-CM | POA: Diagnosis not present

## 2024-04-12 DIAGNOSIS — G894 Chronic pain syndrome: Secondary | ICD-10-CM | POA: Diagnosis not present

## 2024-04-12 DIAGNOSIS — I950 Idiopathic hypotension: Secondary | ICD-10-CM | POA: Diagnosis not present

## 2024-04-12 DIAGNOSIS — D631 Anemia in chronic kidney disease: Secondary | ICD-10-CM | POA: Diagnosis not present

## 2024-04-12 DIAGNOSIS — R42 Dizziness and giddiness: Secondary | ICD-10-CM | POA: Diagnosis not present

## 2024-04-12 DIAGNOSIS — Z5982 Transportation insecurity: Secondary | ICD-10-CM | POA: Diagnosis not present

## 2024-04-12 DIAGNOSIS — E559 Vitamin D deficiency, unspecified: Secondary | ICD-10-CM | POA: Diagnosis not present

## 2024-04-24 DIAGNOSIS — Z789 Other specified health status: Secondary | ICD-10-CM | POA: Diagnosis not present

## 2024-04-24 DIAGNOSIS — R531 Weakness: Secondary | ICD-10-CM | POA: Diagnosis not present

## 2024-04-24 DIAGNOSIS — M1000 Idiopathic gout, unspecified site: Secondary | ICD-10-CM | POA: Diagnosis not present

## 2024-04-24 DIAGNOSIS — M8580 Other specified disorders of bone density and structure, unspecified site: Secondary | ICD-10-CM | POA: Diagnosis not present

## 2024-04-24 DIAGNOSIS — Z638 Other specified problems related to primary support group: Secondary | ICD-10-CM | POA: Diagnosis not present

## 2024-04-24 DIAGNOSIS — M2040 Other hammer toe(s) (acquired), unspecified foot: Secondary | ICD-10-CM | POA: Diagnosis not present

## 2024-04-24 DIAGNOSIS — Z1331 Encounter for screening for depression: Secondary | ICD-10-CM | POA: Diagnosis not present

## 2024-04-24 DIAGNOSIS — M797 Fibromyalgia: Secondary | ICD-10-CM | POA: Diagnosis not present

## 2024-04-24 DIAGNOSIS — G609 Hereditary and idiopathic neuropathy, unspecified: Secondary | ICD-10-CM | POA: Diagnosis not present

## 2024-05-10 DIAGNOSIS — Z79890 Hormone replacement therapy (postmenopausal): Secondary | ICD-10-CM | POA: Diagnosis not present

## 2024-05-10 DIAGNOSIS — Z5181 Encounter for therapeutic drug level monitoring: Secondary | ICD-10-CM | POA: Diagnosis not present

## 2024-05-22 DIAGNOSIS — H35372 Puckering of macula, left eye: Secondary | ICD-10-CM | POA: Diagnosis not present

## 2024-05-22 DIAGNOSIS — H4322 Crystalline deposits in vitreous body, left eye: Secondary | ICD-10-CM | POA: Diagnosis not present

## 2024-05-22 DIAGNOSIS — H2511 Age-related nuclear cataract, right eye: Secondary | ICD-10-CM | POA: Diagnosis not present

## 2024-05-22 DIAGNOSIS — H26492 Other secondary cataract, left eye: Secondary | ICD-10-CM | POA: Diagnosis not present

## 2024-05-31 ENCOUNTER — Ambulatory Visit: Admitting: Urology

## 2024-05-31 VITALS — BP 114/71 | HR 70 | Wt 186.0 lb

## 2024-05-31 DIAGNOSIS — R3915 Urgency of urination: Secondary | ICD-10-CM

## 2024-05-31 DIAGNOSIS — R351 Nocturia: Secondary | ICD-10-CM | POA: Diagnosis not present

## 2024-05-31 DIAGNOSIS — Z87448 Personal history of other diseases of urinary system: Secondary | ICD-10-CM

## 2024-05-31 DIAGNOSIS — Z87898 Personal history of other specified conditions: Secondary | ICD-10-CM

## 2024-05-31 DIAGNOSIS — N5314 Retrograde ejaculation: Secondary | ICD-10-CM | POA: Diagnosis not present

## 2024-05-31 DIAGNOSIS — Z125 Encounter for screening for malignant neoplasm of prostate: Secondary | ICD-10-CM

## 2024-05-31 DIAGNOSIS — N3281 Overactive bladder: Secondary | ICD-10-CM

## 2024-05-31 LAB — BLADDER SCAN AMB NON-IMAGING

## 2024-05-31 MED ORDER — TROSPIUM CHLORIDE 20 MG PO TABS: 20.0000 mg | ORAL_TABLET | Freq: Two times a day (BID) | ORAL | 11 refills | Status: AC

## 2024-05-31 NOTE — Progress Notes (Signed)
 05/31/24 1:09 PM   Mark Jimenez 08-18-1951 [id number]  CC: Urinary symptoms, history of HOLEP, ejaculation concerns  HPI: Extremely comorbid 73 year old male who underwent a uncomplicated HOLEP with me on 02/21/2021 for Foley dependent urinary retention.  He had previously been admitted at [facility] with retention, bilateral hydronephrosis and renal failure, failed multiple voiding trials.  After having his catheter removed postoperatively and voiding spontaneously, he never returned for any follow-up visits.  History is challenging to obtain.  It sounds like he had been doing well over the last few years until the last 6 weeks he reports some urgency, and increased nocturia.  He denies any dysuria or gross hematuria.  He drinks primarily Diet Coke during the day.  He was unable to give a urinalysis today, PVR was normal at 8ml.  He also reports some concerns with decreased ejaculatory fluid.  PSA was normal at 0.38 from April 2025.  He also had a CT for abdominal pain in 2023 that showed no hydronephrosis, decompressed bladder.  PMH: Past Medical History:  Diagnosis Date   Anemia    Chronic kidney disease    Chronic tophaceous gout    GERD (gastroesophageal reflux disease)    Gout    History of kidney stones    Peripheral neuropathy    Wears dentures    partial upper    Surgical History: Past Surgical History:  Procedure Laterality Date   CATARACT EXTRACTION W/PHACO Left 01/06/2022   Procedure: CATARACT EXTRACTION PHACO AND INTRAOCULAR LENS PLACEMENT (IOC) LEFT 5.16 00:41.4;  Surgeon: Jaye Fallow, MD;  Location: [facility];  Service: Ophthalmology;  Laterality: Left;   EYE SURGERY     HOLEP-LASER ENUCLEATION OF THE PROSTATE WITH MORCELLATION N/A 02/21/2021   Procedure: HOLEP-LASER ENUCLEATION OF THE PROSTATE WITH MORCELLATION;  Surgeon: Francisca Redell BROCKS, MD;  Location: [facility];  Service: Urology;  Laterality: N/A;   TONSILLECTOMY     TOTAL HIP ARTHROPLASTY Left  08/31/2020   Procedure: TOTAL HIP ARTHROPLASTY ANTERIOR APPROACH;  Surgeon: Kathlynn Ozell, MD;  Location: [facility];  Service: Orthopedics;  Laterality: Left;   VARICOSE VEIN SURGERY Right    Social History:  reports that he has quit smoking. His smokeless tobacco use includes chew. He reports that he does not currently use alcohol after a past usage of about 2.0 standard drinks of alcohol per week. He reports that he does not currently use drugs.  Physical Exam: BP 114/71 (BP Location: Left Arm, Patient Position: Sitting, Cuff Size: Normal)   Pulse 70   Wt 186 lb (84.4 kg)   SpO2 100%   BMI 25.23 kg/m    Constitutional: Frail-appearing, in wheelchair Cardiovascular: No clubbing, cyanosis, or edema. Respiratory: Normal respiratory effort, no increased work of breathing. GI: Abdomen is soft, nontender, nondistended, no abdominal masses   Laboratory Data: Reviewed, see HPI  Pertinent Imaging: I have personally viewed and interpreted the CT from 2023 showing no hydronephrosis, decompressed bladder.  Assessment & Plan:   73 year old very comorbid male with urgency and nocturia that is new over the last 6 weeks.  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  We discussed possible etiologies including infection, overactive bladder, bladder irritants, aging.  I recommended checking a urinalysis but he was unable to void for urinalysis and bladder scan today was only 8ml.  He strongly desires a trial  of OAB medication, risk and benefits discussed extensively.  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

## 2024-05-31 NOTE — Patient Instructions (Signed)

## 2024-06-05 DIAGNOSIS — H26492 Other secondary cataract, left eye: Secondary | ICD-10-CM | POA: Diagnosis not present

## 2024-06-12 DIAGNOSIS — Z9842 Cataract extraction status, left eye: Secondary | ICD-10-CM | POA: Diagnosis not present

## 2024-07-11 ENCOUNTER — Ambulatory Visit: Admitting: Urology
